# Patient Record
Sex: Female | Born: 1975 | Race: White | Hispanic: No | Marital: Single
Health system: Southern US, Community
[De-identification: ages and names within clinical notes are randomized; demographics above are authoritative.]

## PROBLEM LIST (undated history)

## (undated) DIAGNOSIS — R87629 Unspecified abnormal cytological findings in specimens from vagina: Secondary | ICD-10-CM

## (undated) HISTORY — PX: TONSILLECTOMY: SUR1361

## (undated) HISTORY — DX: Unspecified abnormal cytological findings in specimens from vagina: R87.629

## (undated) HISTORY — PX: APPENDECTOMY: SHX54

---

## 2000-04-10 ENCOUNTER — Inpatient Hospital Stay (HOSPITAL_COMMUNITY): Admission: AD | Admit: 2000-04-10 | Discharge: 2000-04-10 | Payer: Self-pay | Admitting: *Deleted

## 2000-04-10 ENCOUNTER — Encounter: Payer: Self-pay | Admitting: *Deleted

## 2013-03-03 HISTORY — PX: OTHER SURGICAL HISTORY: SHX169

## 2015-03-04 NOTE — L&D Delivery Note (Signed)
Delivery Note At 11:25 PM a viable female was delivered via VBAC, Spontaneous (Presentation: ROA ).  APGAR: 9, 9; weight: pending.   Placenta status: spont, intact, but smaller than expected with calcifications- to path. Cord: 3 vessel.    Anesthesia:  none Episiotomy: None Lacerations:  none Est. Blood Loss (mL): 25  Mom to postpartum.  Baby to Couplet care / Skin to Skin.  Cam HaiSHAW, Ara Grandmaison CNM 12/19/2015, 11:54 PM

## 2015-07-13 ENCOUNTER — Other Ambulatory Visit (HOSPITAL_COMMUNITY)
Admission: RE | Admit: 2015-07-13 | Discharge: 2015-07-13 | Disposition: A | Discharge status: Home / Self Care | Payer: Medicaid Other | Source: Ambulatory Visit | Attending: Family | Admitting: Family

## 2015-07-13 ENCOUNTER — Ambulatory Visit (INDEPENDENT_AMBULATORY_CARE_PROVIDER_SITE_OTHER): Payer: Medicaid Other | Admitting: Family

## 2015-07-13 ENCOUNTER — Encounter: Payer: Self-pay | Admitting: Family

## 2015-07-13 VITALS — BP 137/89 | HR 99 | Ht 69.0 in | Wt 206.0 lb

## 2015-07-13 DIAGNOSIS — Z113 Encounter for screening for infections with a predominantly sexual mode of transmission: Secondary | ICD-10-CM | POA: Diagnosis present

## 2015-07-13 DIAGNOSIS — Z01419 Encounter for gynecological examination (general) (routine) without abnormal findings: Secondary | ICD-10-CM | POA: Diagnosis present

## 2015-07-13 DIAGNOSIS — O0992 Supervision of high risk pregnancy, unspecified, second trimester: Secondary | ICD-10-CM | POA: Diagnosis not present

## 2015-07-13 DIAGNOSIS — Z36 Encounter for antenatal screening of mother: Secondary | ICD-10-CM

## 2015-07-13 DIAGNOSIS — O09529 Supervision of elderly multigravida, unspecified trimester: Secondary | ICD-10-CM | POA: Insufficient documentation

## 2015-07-13 DIAGNOSIS — Z1151 Encounter for screening for human papillomavirus (HPV): Secondary | ICD-10-CM | POA: Insufficient documentation

## 2015-07-13 DIAGNOSIS — O34219 Maternal care for unspecified type scar from previous cesarean delivery: Secondary | ICD-10-CM

## 2015-07-13 DIAGNOSIS — O09892 Supervision of other high risk pregnancies, second trimester: Secondary | ICD-10-CM

## 2015-07-13 DIAGNOSIS — O99820 Streptococcus B carrier state complicating pregnancy: Secondary | ICD-10-CM | POA: Insufficient documentation

## 2015-07-13 DIAGNOSIS — O09522 Supervision of elderly multigravida, second trimester: Secondary | ICD-10-CM

## 2015-07-13 DIAGNOSIS — Z1389 Encounter for screening for other disorder: Secondary | ICD-10-CM

## 2015-07-13 LAB — HIV ANTIBODY (ROUTINE TESTING W REFLEX): HIV 1&2 Ab, 4th Generation: NONREACTIVE

## 2015-07-13 MED ORDER — PRENATE DHA 18-0.6-0.4-300 MG PO CAPS: 1.0000 | ORAL_CAPSULE | Freq: Every day | ORAL | Status: DC

## 2015-07-13 NOTE — Patient Instructions (Signed)

## 2015-07-13 NOTE — Progress Notes (Signed)
Patient transfer from [place] where she was seen one time for intial OB.Patient reports that she was seen and they told her it would be likely that she would have a miscarriage. Has not had anymore bleeding since early April.  Bedside ultrasound:Femur length consistent with 15.3 weeks. Armandina StammerJennifer Freddy Spadafora RN BSN

## 2015-07-13 NOTE — Progress Notes (Signed)
Subjective:    Amy Horn is a [id number] 6975w3d being seen today for her first obstetrical visit.  Her obstetrical history is significant for advanced maternal age and prior csection due to breech presentation with prior and subsequent vaginal deliveries. Experienced bleeding in early pregnancy.   Also discussed feelings surrounding having newborn with autistic son, challenges with space and needs of older son.   Patient does intend to breast feed. Pregnancy history fully reviewed.  Patient reports no complaints.  Filed Vitals:   07/13/15 0900 07/13/15 0901  BP:  137/89  Pulse:  99  Height: 5\' 9"  (1.753 m)   Weight:  206 lb (93.441 kg)    HISTORY: OB History  Gravida Para Term Preterm AB SAB TAB Ectopic Multiple Living  10 5 5  3 3   2 6     # Outcome Date GA Lbr Len/2nd Weight Sex Delivery Anes PTL Lv  10 Current           9 Gravida 05/02/06 5014w0d  5 lb (2.268 kg) F    Y  8 SAB 2008          7 Term 03/04/05    F CS-Unspec  Y Y     Comments: twins  6 Term 03/04/05 1014w0d  7 lb 1 oz (3.204 kg) M CS-Unspec   Y  5 Term 08/25/00    Amy MechM Vag-Spont   Y  4 Term 08/25/00 4014w0d  5 lb 2 oz (2.325 kg) F Vag-Spont   Y  3 SAB 2000          2 Term 07/30/93 1748w0d  8 lb 13 oz (3.997 kg) F Vag-Spont EPI  Y  1 SAB 1994             History reviewed. No pertinent past medical history. Past Surgical History  Procedure Laterality Date  . Herniated disc  2015    L5  . Tonsillectomy    . Appendectomy    . Cesarean section  2007   Family History  Problem Relation Age of Onset  . Heart disease Mother   . Hypertension Father   . Cancer Paternal Grandmother   . Diabetes Neg Hx      Exam   BP 137/89 mmHg  Pulse 99  Ht 5\' 9"  (1.753 m)  Wt 206 lb (93.441 kg)  BMI 30.41 kg/m2  LMP 03/27/2015 (Approximate) Uterine Size: size equals dates  Pelvic Exam:    Perineum: No Hemorrhoids, Normal Perineum   Vulva: normal   Vagina:  normal mucosa, normal discharge, no palpable nodules   pH:  Not done   Cervix: no bleeding following Pap, no cervical motion tenderness and no lesions   Adnexa: normal adnexa and no mass, fullness, tenderness   Bony Pelvis: Adequate  System: Breast:  No nipple retraction or dimpling, No nipple discharge or bleeding, No axillary or supraclavicular adenopathy, Normal to palpation without dominant masses   Skin: normal coloration and turgor, no rashes    Neurologic: negative   Extremities: normal strength, tone, and muscle mass   HEENT neck supple with midline trachea and thyroid without masses   Mouth/Teeth mucous membranes moist, pharynx normal without lesions   Neck supple and no masses   Cardiovascular: regular rate and rhythm, no murmurs or gallops   Respiratory:  appears well, vitals normal, no respiratory distress, acyanotic, normal RR, neck free of mass or lymphadenopathy, chest clear, no wheezing, crepitations, rhonchi, normal symmetric air entry   Abdomen: soft,  non-tender; bowel sounds normal; no masses,  no organomegaly   Urinary: urethral meatus normal      Assessment:    Pregnancy: [id number] Patient Active Problem List   Diagnosis Date Noted  . Supervision of high risk pregnancy, antepartum 07/13/2015  . Multigravida of advanced maternal age in second trimester 07/13/2015  . Previous cesarean delivery affecting pregnancy, antepartum 07/13/2015        Plan:     Initial labs drawn.  Pap smear collected. Prenatal vitamins. Problem list reviewed and updated. Genetic Screening discussed First Screen, Quad Screen, and Harmony: declined.  Ultrasound discussed; fetal survey: ordered.  Follow up in 4 weeks.   Marlis Edelson 07/13/2015

## 2015-07-15 LAB — CULTURE, URINE COMPREHENSIVE
Colony Count: NO GROWTH
Organism ID, Bacteria: NO GROWTH

## 2015-07-16 LAB — OBSTETRIC PANEL
Antibody Screen: NEGATIVE
Basophils Absolute: 0 cells/uL (ref 0–200)
Basophils Relative: 0 %
Eosinophils Absolute: 735 cells/uL — ABNORMAL HIGH (ref 15–500)
Eosinophils Relative: 5 %
HCT: 40.5 % (ref 35.0–45.0)
Hemoglobin: 13.5 g/dL (ref 11.7–15.5)
Hepatitis B Surface Ag: NEGATIVE
Lymphocytes Relative: 18 %
Lymphs Abs: 2646 cells/uL (ref 850–3900)
MCH: 28.5 pg (ref 27.0–33.0)
MCHC: 33.3 g/dL (ref 32.0–36.0)
MCV: 85.6 fL (ref 80.0–100.0)
MPV: 10.4 fL (ref 7.5–12.5)
Monocytes Absolute: 1029 cells/uL — ABNORMAL HIGH (ref 200–950)
Monocytes Relative: 7 %
Neutro Abs: 10290 cells/uL — ABNORMAL HIGH (ref 1500–7800)
Neutrophils Relative %: 70 %
Platelets: 384 10*3/uL (ref 140–400)
RBC: 4.73 MIL/uL (ref 3.80–5.10)
RDW: 14.2 % (ref 11.0–15.0)
Rh Type: POSITIVE
Rubella: 1.04 Index — ABNORMAL HIGH (ref <0.90)
WBC: 14.7 10*3/uL — ABNORMAL HIGH (ref 3.8–10.8)

## 2015-07-16 LAB — URINE CYTOLOGY ANCILLARY ONLY
Chlamydia: NEGATIVE
Neisseria Gonorrhea: NEGATIVE

## 2015-07-17 LAB — CYTOLOGY - PAP

## 2015-07-20 LAB — CYSTIC FIBROSIS DIAGNOSTIC STUDY

## 2015-07-25 ENCOUNTER — Encounter: Payer: Self-pay | Admitting: *Deleted

## 2015-08-01 ENCOUNTER — Encounter (HOSPITAL_COMMUNITY): Payer: Self-pay | Admitting: Family

## 2015-08-08 ENCOUNTER — Encounter (HOSPITAL_COMMUNITY): Payer: Self-pay

## 2015-08-09 ENCOUNTER — Encounter (HOSPITAL_COMMUNITY): Payer: Self-pay

## 2015-08-09 ENCOUNTER — Ambulatory Visit (HOSPITAL_COMMUNITY)
Admission: RE | Admit: 2015-08-09 | Discharge: 2015-08-09 | Disposition: A | Discharge status: Home / Self Care | Payer: Medicaid Other | Source: Ambulatory Visit | Attending: Family | Admitting: Family

## 2015-08-09 ENCOUNTER — Other Ambulatory Visit: Payer: Self-pay | Admitting: Family

## 2015-08-09 DIAGNOSIS — Z3A19 19 weeks gestation of pregnancy: Secondary | ICD-10-CM | POA: Diagnosis not present

## 2015-08-09 DIAGNOSIS — Z36 Encounter for antenatal screening of mother: Secondary | ICD-10-CM | POA: Insufficient documentation

## 2015-08-09 DIAGNOSIS — Z1389 Encounter for screening for other disorder: Secondary | ICD-10-CM

## 2015-08-09 DIAGNOSIS — O09522 Supervision of elderly multigravida, second trimester: Secondary | ICD-10-CM | POA: Diagnosis not present

## 2015-08-09 DIAGNOSIS — O34219 Maternal care for unspecified type scar from previous cesarean delivery: Secondary | ICD-10-CM | POA: Insufficient documentation

## 2015-08-09 DIAGNOSIS — Z3689 Encounter for other specified antenatal screening: Secondary | ICD-10-CM

## 2015-08-10 ENCOUNTER — Encounter: Payer: Medicaid Other | Admitting: Certified Nurse Midwife

## 2015-08-13 ENCOUNTER — Ambulatory Visit (INDEPENDENT_AMBULATORY_CARE_PROVIDER_SITE_OTHER): Payer: Medicaid Other | Admitting: Obstetrics & Gynecology

## 2015-08-13 VITALS — BP 135/83 | HR 84 | Wt 207.0 lb

## 2015-08-13 DIAGNOSIS — O0992 Supervision of high risk pregnancy, unspecified, second trimester: Secondary | ICD-10-CM

## 2015-08-13 DIAGNOSIS — O09522 Supervision of elderly multigravida, second trimester: Secondary | ICD-10-CM

## 2015-08-13 NOTE — Progress Notes (Signed)
Subjective:  Jonita AlbeeChristina R Twersky is a 40 y.o. [phone] at 652w6d being seen today for ongoing prenatal care.  She is currently monitored for the following issues for this high-risk pregnancy and has Supervision of high risk pregnancy, antepartum; Multigravida of advanced maternal age; and Previous cesarean delivery affecting pregnancy, antepartum on her problem list.  Patient reports no complaints.  Contractions: Not present. Vag. Bleeding: None.  Movement: Present. Denies leaking of fluid.   The following portions of the patient's history were reviewed and updated as appropriate: allergies, current medications, past family history, past medical history, past social history, past surgical history and problem list. Problem list updated.  Objective:   Filed Vitals:   08/13/15 1412  BP: 135/83  Pulse: 84  Weight: 207 lb (93.895 kg)    Fetal Status: Fetal Heart Rate (bpm): 151   Movement: Present     General:  Alert, oriented and cooperative. Patient is in no acute distress.  Skin: Skin is warm and dry. No rash noted.   Cardiovascular: Normal heart rate noted  Respiratory: Normal respiratory effort, no problems with respiration noted  Abdomen: Soft, gravid, appropriate for gestational age. Pain/Pressure: Absent     Pelvic: Cervical exam deferred        Extremities: Normal range of motion.  Edema: None  Mental Status: Normal mood and affect. Normal behavior. Normal judgment and thought content.   Urinalysis: Urine Protein: Negative Urine Glucose: Negative  Assessment and Plan:  Pregnancy: [id number] at 282w6d  1. Multigravida of advanced maternal age in second trimester Advanced Maternal Age > 41 y.o. - 69O09.519 (primip), 23O09.529 (multip) 20-24-28-32-36 36//2 x wk 40  Serial growth scans ordered. Had normal anatomy scan. Will start testing at 36 weeks. - US MFM OB FOLLOW UP; Standing  2. Supervision of high risk pregnancy, antepartum, second trimester Preterm labor symptoms and general  obstetric precautions including but not limited to vaginal bleeding, contractions, leaking of fluid and fetal movement were reviewed in detail with the patient. Please refer to After Visit Summary for other counseling recommendations.  Return in about 4 weeks (around 09/10/2015) for OB Visit.   Tereso NewcomerUgonna A Jahmil Macleod, MD

## 2015-08-13 NOTE — Patient Instructions (Signed)
Return to clinic for any scheduled appointments or obstetric concerns, or go to [facility] for evaluation  

## 2015-09-10 ENCOUNTER — Ambulatory Visit (INDEPENDENT_AMBULATORY_CARE_PROVIDER_SITE_OTHER): Payer: Medicaid Other | Admitting: Obstetrics & Gynecology

## 2015-09-10 VITALS — BP 125/78 | HR 94 | Wt 210.0 lb

## 2015-09-10 DIAGNOSIS — O34219 Maternal care for unspecified type scar from previous cesarean delivery: Secondary | ICD-10-CM

## 2015-09-10 DIAGNOSIS — O99210 Obesity complicating pregnancy, unspecified trimester: Secondary | ICD-10-CM

## 2015-09-10 DIAGNOSIS — O0992 Supervision of high risk pregnancy, unspecified, second trimester: Secondary | ICD-10-CM

## 2015-09-10 DIAGNOSIS — Z641 Problems related to multiparity: Secondary | ICD-10-CM

## 2015-09-10 DIAGNOSIS — O09522 Supervision of elderly multigravida, second trimester: Secondary | ICD-10-CM

## 2015-09-10 NOTE — Progress Notes (Signed)
Subjective:  Amy Horn is a 40 y.o. [phone] at 6724w6d being seen today for ongoing prenatal care.  She is currently monitored for the following issues for this high-risk pregnancy and has Supervision of high risk pregnancy, antepartum; Multigravida of advanced maternal age; Previous cesarean delivery affecting pregnancy, antepartum; and Obesity in pregnancy on her problem list.  Patient reports no complaints.  Contractions: Not present. Vag. Bleeding: None.  Movement: Present. Denies leaking of fluid.   The following portions of the patient's history were reviewed and updated as appropriate: allergies, current medications, past family history, past medical history, past social history, past surgical history and problem list. Problem list updated.  Objective:   Filed Vitals:   09/10/15 1434  BP: 125/78  Pulse: 94  Weight: 210 lb (95.255 kg)    Fetal Status: Fetal Heart Rate (bpm): 141   Movement: Present     General:  Alert, oriented and cooperative. Patient is in no acute distress.  Skin: Skin is warm and dry. No rash noted.   Cardiovascular: Normal heart rate noted  Respiratory: Normal respiratory effort, no problems with respiration noted  Abdomen: Soft, gravid, appropriate for gestational age. Pain/Pressure: Absent     Pelvic:  Cervical exam deferred        Extremities: Normal range of motion.  Edema: None  Mental Status: Normal mood and affect. Normal behavior. Normal judgment and thought content.   Urinalysis: Urine Protein: Negative Urine Glucose: Negative  Assessment and Plan:  Pregnancy: [id number] at 4024w6d  1. Previous cesarean delivery affecting pregnancy, antepartum - She desires a TOLAC  2. Multigravida of advanced maternal age, second trimester - Follow up MFM u/s next week  3. Supervision of high risk pregnancy, antepartum, second trimester   4. Obesity in pregnancy - Discussed weight gain recommendations  Preterm labor symptoms and general obstetric  precautions including but not limited to vaginal bleeding, contractions, leaking of fluid and fetal movement were reviewed in detail with the patient. Please refer to After Visit Summary for other counseling recommendations.  Return in about 4 weeks (around 10/08/2015) for glucola, labs, tdap.   Allie BossierMyra C Luvada Salamone, MD

## 2015-09-12 ENCOUNTER — Ambulatory Visit (HOSPITAL_COMMUNITY)
Admission: RE | Admit: 2015-09-12 | Discharge: 2015-09-12 | Disposition: A | Discharge status: Home / Self Care | Payer: Medicaid Other | Source: Ambulatory Visit | Attending: Obstetrics & Gynecology | Admitting: Obstetrics & Gynecology

## 2015-09-12 ENCOUNTER — Encounter (HOSPITAL_COMMUNITY): Payer: Self-pay

## 2015-09-12 DIAGNOSIS — O09522 Supervision of elderly multigravida, second trimester: Secondary | ICD-10-CM | POA: Diagnosis not present

## 2015-09-12 DIAGNOSIS — O34219 Maternal care for unspecified type scar from previous cesarean delivery: Secondary | ICD-10-CM | POA: Insufficient documentation

## 2015-09-12 DIAGNOSIS — Z3A24 24 weeks gestation of pregnancy: Secondary | ICD-10-CM | POA: Diagnosis not present

## 2015-10-08 ENCOUNTER — Ambulatory Visit (INDEPENDENT_AMBULATORY_CARE_PROVIDER_SITE_OTHER): Payer: Medicaid Other | Admitting: Obstetrics & Gynecology

## 2015-10-08 VITALS — BP 127/85 | HR 88 | Wt 211.0 lb

## 2015-10-08 DIAGNOSIS — E669 Obesity, unspecified: Secondary | ICD-10-CM

## 2015-10-08 DIAGNOSIS — Z3492 Encounter for supervision of normal pregnancy, unspecified, second trimester: Secondary | ICD-10-CM

## 2015-10-08 DIAGNOSIS — O99212 Obesity complicating pregnancy, second trimester: Secondary | ICD-10-CM | POA: Diagnosis not present

## 2015-10-08 DIAGNOSIS — O09522 Supervision of elderly multigravida, second trimester: Secondary | ICD-10-CM

## 2015-10-08 DIAGNOSIS — O0993 Supervision of high risk pregnancy, unspecified, third trimester: Secondary | ICD-10-CM

## 2015-10-08 NOTE — Progress Notes (Signed)
Subjective:  Amy Horn is a 40 y.o. [phone] at 7140w6d being seen today for ongoing prenatal care.  She is currently monitored for the following issues for this high-risk pregnancy and has Supervision of high risk pregnancy, antepartum; Multigravida of advanced maternal age; Previous cesarean delivery affecting pregnancy, antepartum; Obesity in pregnancy; and Grand multiparity on her problem list.  Patient reports no complaints.  Contractions: Not present. Vag. Bleeding: None.  Movement: Present. Denies leaking of fluid.   The following portions of the patient's history were reviewed and updated as appropriate: allergies, current medications, past family history, past medical history, past social history, past surgical history and problem list. Problem list updated.  Objective:   Vitals:   10/08/15 1540  BP: 127/85  Pulse: 88  Weight: 211 lb (95.7 kg)    Fetal Status: Fetal Heart Rate (bpm): 145   Movement: Present     General:  Alert, oriented and cooperative. Patient is in no acute distress.  Skin: Skin is warm and dry. No rash noted.   Cardiovascular: Normal heart rate noted  Respiratory: Normal respiratory effort, no problems with respiration noted  Abdomen: Soft, gravid, appropriate for gestational age. Pain/Pressure: Present     Pelvic:  Cervical exam deferred        Extremities: Normal range of motion.  Edema: Trace  Mental Status: Normal mood and affect. Normal behavior. Normal judgment and thought content.   Urinalysis: Urine Protein: Negative Urine Glucose: Negative  Assessment and Plan:  Pregnancy: [id number] at 2540w6d  1. Normal pregnancy, second trimester  - Glucose Tolerance, 1 HR (50g) - HIV antibody (with reflex) - CBC - RPR  Preterm labor symptoms and general obstetric precautions including but not limited to vaginal bleeding, contractions, leaking of fluid and fetal movement were reviewed in detail with the patient. Please refer to After Visit Summary for  other counseling recommendations.  No Follow-up on file.   Allie BossierMyra C Hezzie Karim, MD

## 2015-10-09 ENCOUNTER — Encounter: Payer: Self-pay | Admitting: *Deleted

## 2015-10-09 ENCOUNTER — Telehealth: Payer: Self-pay | Admitting: *Deleted

## 2015-10-09 DIAGNOSIS — O0993 Supervision of high risk pregnancy, unspecified, third trimester: Secondary | ICD-10-CM

## 2015-10-09 LAB — GLUCOSE TOLERANCE, 1 HOUR (50G) W/O FASTING: GLUCOSE, 1 HR, GESTATIONAL: 111 mg/dL (ref <140)

## 2015-10-09 LAB — HIV ANTIBODY (ROUTINE TESTING W REFLEX): HIV 1&2 Ab, 4th Generation: NONREACTIVE

## 2015-10-09 LAB — CBC
HEMATOCRIT: 36.8 % (ref 35.0–45.0)
Hemoglobin: 12.2 g/dL (ref 11.7–15.5)
MCH: 27.4 pg (ref 27.0–33.0)
MCHC: 33.2 g/dL (ref 32.0–36.0)
MCV: 82.7 fL (ref 80.0–100.0)
MPV: 10.9 fL (ref 7.5–12.5)
PLATELETS: 381 10*3/uL (ref 140–400)
RBC: 4.45 MIL/uL (ref 3.80–5.10)
RDW: 15 % (ref 11.0–15.0)
WBC: 14.9 10*3/uL — AB (ref 3.8–10.8)

## 2015-10-09 LAB — RPR

## 2015-10-09 NOTE — Telephone Encounter (Signed)
Pt notified of norma 28 week labs

## 2015-10-24 ENCOUNTER — Ambulatory Visit (HOSPITAL_COMMUNITY): Payer: Medicaid Other

## 2015-10-29 ENCOUNTER — Ambulatory Visit (INDEPENDENT_AMBULATORY_CARE_PROVIDER_SITE_OTHER): Payer: Medicaid Other | Admitting: Advanced Practice Midwife

## 2015-10-29 VITALS — BP 142/88 | HR 80 | Wt 212.0 lb

## 2015-10-29 DIAGNOSIS — O0993 Supervision of high risk pregnancy, unspecified, third trimester: Secondary | ICD-10-CM

## 2015-10-29 DIAGNOSIS — O09523 Supervision of elderly multigravida, third trimester: Secondary | ICD-10-CM | POA: Diagnosis not present

## 2015-10-29 DIAGNOSIS — O34219 Maternal care for unspecified type scar from previous cesarean delivery: Secondary | ICD-10-CM | POA: Diagnosis not present

## 2015-10-29 DIAGNOSIS — O163 Unspecified maternal hypertension, third trimester: Secondary | ICD-10-CM

## 2015-10-29 NOTE — Patient Instructions (Signed)

## 2015-10-29 NOTE — Progress Notes (Signed)
  Subjective:  Amy Horn is a 40 y.o. [phone] at 17w6dbeing seen today for ongoing prenatal care.  She is currently monitored for the following issues for this high-risk pregnancy and has Supervision of high risk pregnancy, antepartum; Multigravida of advanced maternal age; Previous cesarean delivery affecting pregnancy, antepartum; Obesity in pregnancy; and [place]iparity on her problem list.  Patient reports no complaints.  Contractions: Not present. Vag. Bleeding: None.  Movement: Present. Denies leaking of fluid.   The following portions of the patient's history were reviewed and updated as appropriate: allergies, current medications, past family history, past medical history, past social history, past surgical history and problem list. Problem list updated.  Objective:   Vitals:   10/29/15 1107  BP: (!) 142/88  Pulse: 80  Weight: 212 lb (96.2 kg)    Fetal Status: Fetal Heart Rate (bpm): 143 Fundal Height: 31 cm Movement: Present     General:  Alert, oriented and cooperative. Patient is in no acute distress.  Skin: Skin is warm and dry. No rash noted.   Cardiovascular: Normal heart rate noted  Respiratory: Normal respiratory effort, no problems with respiration noted  Abdomen: Soft, gravid, appropriate for gestational age. Pain/Pressure: Present     Pelvic:  Cervical exam deferred        Extremities: Normal range of motion.  Edema: Trace  Mental Status: Normal mood and affect. Normal behavior. Normal judgment and thought content.   Urinalysis: Urine Protein: Negative Urine Glucose: Negative  Assessment and Plan:  Pregnancy: [id number] 345w6d1. Previous cesarean delivery affecting pregnancy, antepartum   2. Hypertension affecting pregnancy, third trimester  - CBC - Comp Met (CMET) - Protein / creatinine ratio, urine  3. Multigravida of advanced maternal age, third trimester   4. Supervision of high risk pregnancy, antepartum, third trimester   Preterm  labor symptoms and general obstetric precautions including but not limited to vaginal bleeding, contractions, leaking of fluid and fetal movement were reviewed in detail with the patient. Please refer to After Visit Summary for other counseling recommendations.  GrEd BlalockSKorea/31 F/U in 2 weeks   ViManya SilvasCNNorth Dakota

## 2015-10-29 NOTE — Progress Notes (Signed)
routine

## 2015-10-30 ENCOUNTER — Encounter: Payer: Self-pay | Admitting: *Deleted

## 2015-10-30 LAB — COMPREHENSIVE METABOLIC PANEL
ALK PHOS: 91 U/L (ref 33–115)
ALT: 15 U/L (ref 6–29)
AST: 14 U/L (ref 10–30)
Albumin: 3.4 g/dL — ABNORMAL LOW (ref 3.6–5.1)
BILIRUBIN TOTAL: 0.9 mg/dL (ref 0.2–1.2)
BUN: 5 mg/dL — AB (ref 7–25)
CO2: 22 mmol/L (ref 20–31)
Calcium: 8.8 mg/dL (ref 8.6–10.2)
Chloride: 104 mmol/L (ref 98–110)
Creat: 0.55 mg/dL (ref 0.50–1.10)
GLUCOSE: 73 mg/dL (ref 65–99)
Potassium: 4.6 mmol/L (ref 3.5–5.3)
SODIUM: 137 mmol/L (ref 135–146)
Total Protein: 6.4 g/dL (ref 6.1–8.1)

## 2015-10-30 LAB — PROTEIN / CREATININE RATIO, URINE
Creatinine, Urine: 59 mg/dL (ref 20–320)
Protein Creatinine Ratio: 102 mg/g creat (ref 21–161)
Total Protein, Urine: 6 mg/dL (ref 5–24)

## 2015-10-30 LAB — CBC
HEMATOCRIT: 37.8 % (ref 35.0–45.0)
HEMOGLOBIN: 12.2 g/dL (ref 11.7–15.5)
MCH: 26.9 pg — ABNORMAL LOW (ref 27.0–33.0)
MCHC: 32.3 g/dL (ref 32.0–36.0)
MCV: 83.3 fL (ref 80.0–100.0)
MPV: 11.6 fL (ref 7.5–12.5)
PLATELETS: 380 10*3/uL (ref 140–400)
RBC: 4.54 MIL/uL (ref 3.80–5.10)
RDW: 14.9 % (ref 11.0–15.0)
WBC: 13.3 10*3/uL — AB (ref 3.8–10.8)

## 2015-10-31 ENCOUNTER — Encounter (HOSPITAL_COMMUNITY): Payer: Self-pay

## 2015-10-31 ENCOUNTER — Encounter: Payer: Self-pay | Admitting: Advanced Practice Midwife

## 2015-10-31 ENCOUNTER — Telehealth: Payer: Self-pay | Admitting: *Deleted

## 2015-10-31 DIAGNOSIS — O169 Unspecified maternal hypertension, unspecified trimester: Secondary | ICD-10-CM | POA: Insufficient documentation

## 2015-10-31 NOTE — Telephone Encounter (Signed)
Pt notified of normal pre-eclampsia labs per Ivonne AndrewV Murri, CNM

## 2015-11-01 ENCOUNTER — Other Ambulatory Visit (HOSPITAL_COMMUNITY): Payer: Self-pay | Admitting: Maternal and Fetal Medicine

## 2015-11-01 ENCOUNTER — Encounter (HOSPITAL_COMMUNITY): Payer: Self-pay

## 2015-11-01 ENCOUNTER — Ambulatory Visit (HOSPITAL_COMMUNITY)
Admission: RE | Admit: 2015-11-01 | Discharge: 2015-11-01 | Disposition: A | Discharge status: Home / Self Care | Payer: Medicaid Other | Source: Ambulatory Visit | Attending: Family | Admitting: Family

## 2015-11-01 VITALS — BP 131/93 | HR 90 | Wt 212.0 lb

## 2015-11-01 DIAGNOSIS — Z3A31 31 weeks gestation of pregnancy: Secondary | ICD-10-CM

## 2015-11-01 DIAGNOSIS — O10913 Unspecified pre-existing hypertension complicating pregnancy, third trimester: Secondary | ICD-10-CM

## 2015-11-01 DIAGNOSIS — O09522 Supervision of elderly multigravida, second trimester: Secondary | ICD-10-CM | POA: Diagnosis present

## 2015-11-01 DIAGNOSIS — O169 Unspecified maternal hypertension, unspecified trimester: Secondary | ICD-10-CM

## 2015-11-13 ENCOUNTER — Ambulatory Visit (INDEPENDENT_AMBULATORY_CARE_PROVIDER_SITE_OTHER): Payer: Medicaid Other | Admitting: Obstetrics and Gynecology

## 2015-11-13 VITALS — BP 138/79 | HR 82 | Wt 219.0 lb

## 2015-11-13 DIAGNOSIS — O169 Unspecified maternal hypertension, unspecified trimester: Secondary | ICD-10-CM

## 2015-11-13 DIAGNOSIS — O163 Unspecified maternal hypertension, third trimester: Secondary | ICD-10-CM | POA: Diagnosis not present

## 2015-11-13 DIAGNOSIS — O09523 Supervision of elderly multigravida, third trimester: Secondary | ICD-10-CM

## 2015-11-13 DIAGNOSIS — O99210 Obesity complicating pregnancy, unspecified trimester: Secondary | ICD-10-CM

## 2015-11-13 DIAGNOSIS — O99213 Obesity complicating pregnancy, third trimester: Secondary | ICD-10-CM

## 2015-11-13 DIAGNOSIS — E669 Obesity, unspecified: Secondary | ICD-10-CM | POA: Diagnosis not present

## 2015-11-13 DIAGNOSIS — Z641 Problems related to multiparity: Secondary | ICD-10-CM

## 2015-11-13 DIAGNOSIS — O34219 Maternal care for unspecified type scar from previous cesarean delivery: Secondary | ICD-10-CM

## 2015-11-13 DIAGNOSIS — O0993 Supervision of high risk pregnancy, unspecified, third trimester: Secondary | ICD-10-CM

## 2015-11-13 NOTE — Progress Notes (Signed)
Dizziness and HA's

## 2015-11-13 NOTE — Progress Notes (Signed)
   PRENATAL VISIT NOTE  Subjective:  Jonita AlbeeChristina R Allcock is a 40 y.o. [phone] at [redacted]w[redacted]d being seen today for ongoing prenatal care.  She is currently monitored for the following issues for this high-risk pregnancy and has Supervision of high risk pregnancy, antepartum; Multigravida of advanced maternal age; Previous cesarean delivery affecting pregnancy, antepartum; Obesity in pregnancy; Grand multiparity; and Hypertension in pregnancy, antepartum on her problem list.  Patient reports fatigue.  Contractions: Not present. Vag. Bleeding: None.  Movement: Present. Denies leaking of fluid.   The following portions of the patient's history were reviewed and updated as appropriate: allergies, current medications, past family history, past medical history, past social history, past surgical history and problem list. Problem list updated.  Objective:   Vitals:   11/13/15 1539  BP: 138/79  Pulse: 82  Weight: 219 lb (99.3 kg)    Fetal Status: Fetal Heart Rate (bpm): 147   Movement: Present     General:  Alert, oriented and cooperative. Patient is in no acute distress.  Skin: Skin is warm and dry. No rash noted.   Cardiovascular: Normal heart rate noted  Respiratory: Normal respiratory effort, no problems with respiration noted  Abdomen: Soft, gravid, appropriate for gestational age. Pain/Pressure: Present     Pelvic:  Cervical exam deferred        Extremities: Normal range of motion.  Edema: Trace  Mental Status: Normal mood and affect. Normal behavior. Normal judgment and thought content.   Urinalysis: Urine Protein: Trace Urine Glucose: Negative  Assessment and Plan:  Pregnancy: [id number] at [redacted]w[redacted]d  1. Supervision of high risk pregnancy, antepartum, third trimester Patient is doing well overall. She admits to not drinking a lot of water and not eating as much as she should. She often skips meals. Advised to stay well hydrated and to snack all day if needed to aid with fatigue and  lightheadedness Patient reports infrequent headaches relieved by tylenol  2. Previous cesarean delivery affecting pregnancy, antepartum   3. Obesity in pregnancy   4. Multigravida of advanced maternal age, third trimester   5. Hypertension in pregnancy, antepartum, unspecified trimester Contnue close monitoring Si/sx of preeclampsia reviewed  6. Grand multiparity   Preterm labor symptoms and general obstetric precautions including but not limited to vaginal bleeding, contractions, leaking of fluid and fetal movement were reviewed in detail with the patient. Please refer to After Visit Summary for other counseling recommendations.  No Follow-up on file.  Catalina AntiguaPeggy Maurion Walkowiak, MD

## 2015-11-26 ENCOUNTER — Ambulatory Visit (INDEPENDENT_AMBULATORY_CARE_PROVIDER_SITE_OTHER): Payer: Medicaid Other | Admitting: Certified Nurse Midwife

## 2015-11-26 VITALS — BP 134/89 | HR 94 | Wt 217.0 lb

## 2015-11-26 DIAGNOSIS — O34219 Maternal care for unspecified type scar from previous cesarean delivery: Secondary | ICD-10-CM

## 2015-11-26 DIAGNOSIS — O163 Unspecified maternal hypertension, third trimester: Secondary | ICD-10-CM

## 2015-11-26 DIAGNOSIS — O09523 Supervision of elderly multigravida, third trimester: Secondary | ICD-10-CM

## 2015-11-26 DIAGNOSIS — O0993 Supervision of high risk pregnancy, unspecified, third trimester: Secondary | ICD-10-CM

## 2015-11-26 NOTE — Progress Notes (Signed)
Subjective:  Jonita AlbeeChristina R Garrette is a 40 y.o. [phone] at 3546w6d being seen today for ongoing prenatal care.  She is currently monitored for the following issues for this high-risk pregnancy and has Supervision of high risk pregnancy, antepartum; Multigravida of advanced maternal age; Previous cesarean delivery affecting pregnancy, antepartum; Obesity in pregnancy; Grand multiparity; and Hypertension in pregnancy, antepartum on her problem list.  Patient reports cramping x4 days, worse at night, increased pelvic pressure..  Contractions: Not present. Vag. Bleeding: None.  Movement: Present. Denies leaking of fluid. No HA, visual disturbances, or epigastric pain. She reports inadequate hydration.  The following portions of the patient's history were reviewed and updated as appropriate: allergies, current medications, past family history, past medical history, past social history, past surgical history and problem list. Problem list updated.  Objective:   Vitals:   11/26/15 0951  BP: 134/89  Pulse: 94  Weight: 217 lb (98.4 kg)    Fetal Status: Fetal Heart Rate (bpm): 152 Fundal Height: 36 cm Movement: Present  Presentation: Vertex  General:  Alert, oriented and cooperative. Patient is in no acute distress.  Skin: Skin is warm and dry. No rash noted.   Cardiovascular: Normal heart rate noted  Respiratory: Normal respiratory effort, no problems with respiration noted  Abdomen: Soft, gravid, appropriate for gestational age. Pain/Pressure: Present     Pelvic: Vag. Bleeding: None Vag D/C Character: Thick   Cervical exam performed Dilation: Closed Effacement (%): 0 Station: -3  Extremities: Normal range of motion.  Edema: Trace  Mental Status: Normal mood and affect. Normal behavior. Normal judgment and thought content.   Urinalysis: Urine Protein: Negative Urine Glucose: Negative  Assessment and Plan:  Pregnancy: [id number] at 246w6d  1. Supervision of high risk pregnancy, antepartum, third  trimester -no evidence of PTL, cramping likely caused by mild dehydration and fetal engagement  2. Multigravida of advanced maternal age, third trimester -start 2x weekly AT at 36 wks -growth US next wk -delivery by 40 wks  3. Previous cesarean delivery affecting pregnancy, antepartum -planning TOLAC, consent signed  4. Hypertension in pregnancy, antepartum, third trimester -watch BPs closely -pre-e precautions  Preterm labor symptoms and general obstetric precautions including but not limited to vaginal bleeding, contractions, leaking of fluid and fetal movement were reviewed in detail with the patient. Please refer to After Visit Summary for other counseling recommendations.  Return in about 2 weeks (around 12/10/2015) for twice wkly NST, BPP/AFI wkly.   Donette LarryMelanie Hamza Empson, CNM

## 2015-11-28 ENCOUNTER — Ambulatory Visit (HOSPITAL_COMMUNITY)
Admission: RE | Admit: 2015-11-28 | Discharge: 2015-11-28 | Disposition: A | Discharge status: Home / Self Care | Payer: Medicaid Other | Source: Ambulatory Visit | Attending: Family | Admitting: Family

## 2015-11-28 ENCOUNTER — Encounter (HOSPITAL_COMMUNITY): Payer: Self-pay

## 2015-11-28 DIAGNOSIS — O10913 Unspecified pre-existing hypertension complicating pregnancy, third trimester: Secondary | ICD-10-CM

## 2015-11-28 DIAGNOSIS — O10013 Pre-existing essential hypertension complicating pregnancy, third trimester: Secondary | ICD-10-CM | POA: Diagnosis not present

## 2015-11-28 DIAGNOSIS — Z3A35 35 weeks gestation of pregnancy: Secondary | ICD-10-CM | POA: Insufficient documentation

## 2015-11-28 DIAGNOSIS — O09523 Supervision of elderly multigravida, third trimester: Secondary | ICD-10-CM | POA: Diagnosis not present

## 2015-11-28 DIAGNOSIS — O169 Unspecified maternal hypertension, unspecified trimester: Secondary | ICD-10-CM

## 2015-12-11 ENCOUNTER — Other Ambulatory Visit (HOSPITAL_COMMUNITY)
Admission: RE | Admit: 2015-12-11 | Discharge: 2015-12-11 | Disposition: A | Discharge status: Home / Self Care | Payer: Medicaid Other | Source: Ambulatory Visit | Attending: Obstetrics & Gynecology | Admitting: Obstetrics & Gynecology

## 2015-12-11 ENCOUNTER — Ambulatory Visit (INDEPENDENT_AMBULATORY_CARE_PROVIDER_SITE_OTHER): Payer: Medicaid Other | Admitting: Obstetrics & Gynecology

## 2015-12-11 VITALS — BP 139/83 | HR 74 | Wt 217.0 lb

## 2015-12-11 DIAGNOSIS — O163 Unspecified maternal hypertension, third trimester: Secondary | ICD-10-CM

## 2015-12-11 DIAGNOSIS — O0990 Supervision of high risk pregnancy, unspecified, unspecified trimester: Secondary | ICD-10-CM

## 2015-12-11 DIAGNOSIS — Z113 Encounter for screening for infections with a predominantly sexual mode of transmission: Secondary | ICD-10-CM | POA: Insufficient documentation

## 2015-12-11 DIAGNOSIS — Z3689 Encounter for other specified antenatal screening: Secondary | ICD-10-CM

## 2015-12-11 DIAGNOSIS — O133 Gestational [pregnancy-induced] hypertension without significant proteinuria, third trimester: Secondary | ICD-10-CM

## 2015-12-11 LAB — OB RESULTS CONSOLE GC/CHLAMYDIA: Gonorrhea: NEGATIVE

## 2015-12-11 LAB — OB RESULTS CONSOLE GBS: STREP GROUP B AG: POSITIVE

## 2015-12-11 NOTE — Progress Notes (Signed)
   PRENATAL VISIT NOTE  Subjective:  Amy Horn is a 40 y.o. [phone] at 7916w0d being seen today for ongoing prenatal care.  She is currently monitored for the following issues for this high-risk pregnancy and has Supervision of high risk pregnancy, antepartum; Multigravida of advanced maternal age; Previous cesarean delivery affecting pregnancy, antepartum; Obesity in pregnancy; Grand multiparity; and Hypertension in pregnancy, antepartum on her problem list.  Patient reports no complaints.  Contractions: Irregular. Vag. Bleeding: None.  Movement: Present. Denies leaking of fluid.   The following portions of the patient's history were reviewed and updated as appropriate: allergies, current medications, past family history, past medical history, past social history, past surgical history and problem list. Problem list updated.  Objective:   Vitals:   12/11/15 1505  BP: 139/83  Pulse: 74  Weight: 217 lb (98.4 kg)    Fetal Status: Fetal Heart Rate (bpm): NST-R Fundal Height: 35 cm Movement: Present  Presentation: Vertex  General:  Alert, oriented and cooperative. Patient is in no acute distress.  Skin: Skin is warm and dry. No rash noted.   Cardiovascular: Normal heart rate noted  Respiratory: Normal respiratory effort, no problems with respiration noted  Abdomen: Soft, gravid, appropriate for gestational age. Pain/Pressure: Present     Pelvic:  Cervical exam deferred        Extremities: Normal range of motion.  Edema: Trace  Mental Status: Normal mood and affect. Normal behavior. Normal judgment and thought content.   Urinalysis: Urine Protein: Trace Urine Glucose: Negative  Assessment and Plan:  Pregnancy: [id number] at 7816w0d  1. Elevated blood pressure affecting pregnancy in third trimester, antepartum  - Amniotic fluid index with NST; Future - Culture, beta strep (group b only) - GC[facility]Chlamydia [facility] amp [facility]  2. Supervision of high risk pregnancy,  antepartum   Preterm labor symptoms and general obstetric precautions including but not limited to vaginal bleeding, contractions, leaking of fluid and fetal movement were reviewed in detail with the patient. Please refer to After Visit Summary for other counseling recommendations.  No Follow-up on file.  Allie BossierMyra C Mckenze Slone, MD

## 2015-12-11 NOTE — Progress Notes (Signed)
   PRENATAL VISIT NOTE  Subjective:  Amy Horn is a 40 y.o. [phone] at 4334w0d being seen today for ongoing prenatal care.  She is currently monitored for the following issues for this high-risk pregnancy and has Supervision of high risk pregnancy, antepartum; Multigravida of advanced maternal age; Previous cesarean delivery affecting pregnancy, antepartum; Obesity in pregnancy; Grand multiparity; and Hypertension in pregnancy, antepartum on her problem list.  Patient reports no complaints.  Contractions: Irregular. Vag. Bleeding: None.  Movement: Present. Denies leaking of fluid.   The following portions of the patient's history were reviewed and updated as appropriate: allergies, current medications, past family history, past medical history, past social history, past surgical history and problem list. Problem list updated.  Objective:   Vitals:   12/11/15 1505  BP: 139/83  Pulse: 74  Weight: 217 lb (98.4 kg)    Fetal Status: Fetal Heart Rate (bpm): 146   Movement: Present  Presentation: Vertex  General:  Alert, oriented and cooperative. Patient is in no acute distress.  Skin: Skin is warm and dry. No rash noted.   Cardiovascular: Normal heart rate noted  Respiratory: Normal respiratory effort, no problems with respiration noted  Abdomen: Soft, gravid, appropriate for gestational age. Pain/Pressure: Present     Pelvic:  Cervical exam deferred        Extremities: Normal range of motion.  Edema: Trace  Mental Status: Normal mood and affect. Normal behavior. Normal judgment and thought content.   Urinalysis: Urine Protein: Trace Urine Glucose: Negative  Assessment and Plan:  Pregnancy: [id number] at 6634w0d  1. Elevated blood pressure affecting pregnancy in third trimester, antepartum  - Amniotic fluid index with NST; Future  Preterm labor symptoms and general obstetric precautions including but not limited to vaginal bleeding, contractions, leaking of fluid and fetal movement  were reviewed in detail with the patient. Please refer to After Visit Summary for other counseling recommendations.  No Follow-up on file.  Amy BossierMyra C Marsh Heckler, MD

## 2015-12-12 LAB — CULTURE, BETA STREP (GROUP B ONLY)

## 2015-12-13 ENCOUNTER — Telehealth: Payer: Self-pay

## 2015-12-13 LAB — GC/CHLAMYDIA PROBE AMP (~~LOC~~) NOT AT ARMC
Chlamydia: NEGATIVE
NEISSERIA GONORRHEA: NEGATIVE

## 2015-12-13 NOTE — Telephone Encounter (Signed)
Pt is aware of lab results from GBS swab

## 2015-12-14 ENCOUNTER — Ambulatory Visit (INDEPENDENT_AMBULATORY_CARE_PROVIDER_SITE_OTHER): Payer: Medicaid Other | Admitting: *Deleted

## 2015-12-14 VITALS — BP 134/79 | HR 70 | Wt 218.0 lb

## 2015-12-14 DIAGNOSIS — O133 Gestational [pregnancy-induced] hypertension without significant proteinuria, third trimester: Secondary | ICD-10-CM | POA: Diagnosis not present

## 2015-12-14 DIAGNOSIS — O163 Unspecified maternal hypertension, third trimester: Secondary | ICD-10-CM

## 2015-12-18 ENCOUNTER — Encounter: Payer: Medicaid Other | Admitting: Obstetrics & Gynecology

## 2015-12-19 ENCOUNTER — Inpatient Hospital Stay (HOSPITAL_COMMUNITY)
Admission: AD | Admit: 2015-12-19 | Discharge: 2015-12-21 | DRG: 775 | Disposition: A | Discharge status: Home / Self Care | Payer: Medicaid Other | Source: Ambulatory Visit | Attending: Obstetrics & Gynecology | Admitting: Obstetrics & Gynecology

## 2015-12-19 ENCOUNTER — Ambulatory Visit (INDEPENDENT_AMBULATORY_CARE_PROVIDER_SITE_OTHER): Payer: Medicaid Other | Admitting: Obstetrics & Gynecology

## 2015-12-19 ENCOUNTER — Encounter (HOSPITAL_COMMUNITY): Payer: Self-pay | Admitting: *Deleted

## 2015-12-19 ENCOUNTER — Encounter: Payer: Self-pay | Admitting: Obstetrics & Gynecology

## 2015-12-19 VITALS — BP 151/98 | HR 77 | Wt 221.0 lb

## 2015-12-19 DIAGNOSIS — O134 Gestational [pregnancy-induced] hypertension without significant proteinuria, complicating childbirth: Secondary | ICD-10-CM | POA: Diagnosis present

## 2015-12-19 DIAGNOSIS — O34211 Maternal care for low transverse scar from previous cesarean delivery: Secondary | ICD-10-CM | POA: Diagnosis present

## 2015-12-19 DIAGNOSIS — Z3483 Encounter for supervision of other normal pregnancy, third trimester: Secondary | ICD-10-CM | POA: Diagnosis present

## 2015-12-19 DIAGNOSIS — O09523 Supervision of elderly multigravida, third trimester: Secondary | ICD-10-CM

## 2015-12-19 DIAGNOSIS — O99210 Obesity complicating pregnancy, unspecified trimester: Secondary | ICD-10-CM

## 2015-12-19 DIAGNOSIS — O99214 Obesity complicating childbirth: Secondary | ICD-10-CM | POA: Diagnosis present

## 2015-12-19 DIAGNOSIS — O133 Gestational [pregnancy-induced] hypertension without significant proteinuria, third trimester: Secondary | ICD-10-CM

## 2015-12-19 DIAGNOSIS — O139 Gestational [pregnancy-induced] hypertension without significant proteinuria, unspecified trimester: Secondary | ICD-10-CM

## 2015-12-19 DIAGNOSIS — Z8249 Family history of ischemic heart disease and other diseases of the circulatory system: Secondary | ICD-10-CM

## 2015-12-19 DIAGNOSIS — Z3A38 38 weeks gestation of pregnancy: Secondary | ICD-10-CM | POA: Diagnosis not present

## 2015-12-19 DIAGNOSIS — O99824 Streptococcus B carrier state complicating childbirth: Secondary | ICD-10-CM | POA: Diagnosis present

## 2015-12-19 DIAGNOSIS — Z641 Problems related to multiparity: Secondary | ICD-10-CM

## 2015-12-19 DIAGNOSIS — O0990 Supervision of high risk pregnancy, unspecified, unspecified trimester: Secondary | ICD-10-CM

## 2015-12-19 DIAGNOSIS — Z6832 Body mass index (BMI) 32.0-32.9, adult: Secondary | ICD-10-CM | POA: Diagnosis not present

## 2015-12-19 DIAGNOSIS — E669 Obesity, unspecified: Secondary | ICD-10-CM | POA: Diagnosis present

## 2015-12-19 DIAGNOSIS — O34219 Maternal care for unspecified type scar from previous cesarean delivery: Secondary | ICD-10-CM

## 2015-12-19 LAB — CBC
HCT: 36 % (ref 35.0–45.0)
HEMATOCRIT: 35.8 % — AB (ref 36.0–46.0)
HEMOGLOBIN: 12 g/dL (ref 11.7–15.5)
HEMOGLOBIN: 12.1 g/dL (ref 12.0–15.0)
MCH: 26.7 pg — AB (ref 27.0–33.0)
MCH: 27.2 pg (ref 26.0–34.0)
MCHC: 33.3 g/dL (ref 32.0–36.0)
MCHC: 33.8 g/dL (ref 30.0–36.0)
MCV: 80 fL (ref 80.0–100.0)
MCV: 80.4 fL (ref 78.0–100.0)
MPV: 10.6 fL (ref 7.5–12.5)
PLATELETS: 378 10*3/uL (ref 140–400)
Platelets: 362 10*3/uL (ref 150–400)
RBC: 4.5 MIL/uL (ref 3.80–5.10)
RBC: 4.45 MIL/uL (ref 3.87–5.11)
RDW: 14.8 % (ref 11.0–15.0)
RDW: 15.1 % (ref 11.5–15.5)
WBC: 12.8 10*3/uL — AB (ref 3.8–10.8)
WBC: 15.6 10*3/uL — ABNORMAL HIGH (ref 4.0–10.5)

## 2015-12-19 LAB — PROTEIN / CREATININE RATIO, URINE
CREATININE, URINE: 138 mg/dL
PROTEIN CREATININE RATIO: 0.15 mg/mg{creat} (ref 0.00–0.15)
TOTAL PROTEIN, URINE: 21 mg/dL

## 2015-12-19 LAB — COMPREHENSIVE METABOLIC PANEL
ALT: 11 U/L (ref 6–29)
AST: 13 U/L (ref 10–30)
Albumin: 3.2 g/dL — ABNORMAL LOW (ref 3.6–5.1)
Alkaline Phosphatase: 116 U/L — ABNORMAL HIGH (ref 33–115)
BILIRUBIN TOTAL: 0.5 mg/dL (ref 0.2–1.2)
BUN: 6 mg/dL — AB (ref 7–25)
CHLORIDE: 104 mmol/L (ref 98–110)
CO2: 22 mmol/L (ref 20–31)
CREATININE: 0.64 mg/dL (ref 0.50–1.10)
Calcium: 8.6 mg/dL (ref 8.6–10.2)
Glucose, Bld: 80 mg/dL (ref 65–99)
Potassium: 3.5 mmol/L (ref 3.5–5.3)
SODIUM: 136 mmol/L (ref 135–146)
TOTAL PROTEIN: 6.3 g/dL (ref 6.1–8.1)

## 2015-12-19 LAB — TYPE AND SCREEN
ABO/RH(D): O POS
Antibody Screen: NEGATIVE

## 2015-12-19 MED ORDER — LACTATED RINGERS IV SOLN
INTRAVENOUS | Status: DC
  Administered 2015-12-19: 20:00:00 via INTRAVENOUS

## 2015-12-19 MED ORDER — FENTANYL CITRATE (PF) 100 MCG/2ML IJ SOLN
100.0000 ug | INTRAMUSCULAR | Status: DC | PRN
  Administered 2015-12-19 (×2): 100 ug via INTRAVENOUS
  Filled 2015-12-19: qty 2

## 2015-12-19 MED ORDER — LACTATED RINGERS IV SOLN: 500.0000 mL | INTRAVENOUS | Status: DC | PRN

## 2015-12-19 MED ORDER — LIDOCAINE HCL (PF) 1 % IJ SOLN
30.0000 mL | INTRAMUSCULAR | Status: DC | PRN
  Filled 2015-12-19: qty 30

## 2015-12-19 MED ORDER — PENICILLIN G POTASSIUM 5000000 UNITS IJ SOLR
2.5000 10*6.[IU] | INTRAVENOUS | Status: DC
  Filled 2015-12-19 (×4): qty 2.5

## 2015-12-19 MED ORDER — FENTANYL CITRATE (PF) 100 MCG/2ML IJ SOLN
INTRAMUSCULAR | Status: AC
  Administered 2015-12-19: 100 ug via INTRAVENOUS
  Filled 2015-12-19: qty 2

## 2015-12-19 MED ORDER — HYDRALAZINE HCL 20 MG/ML IJ SOLN: 10.0000 mg | Freq: Once | INTRAMUSCULAR | Status: DC | PRN

## 2015-12-19 MED ORDER — SOD CITRATE-CITRIC ACID 500-334 MG/5ML PO SOLN
30.0000 mL | ORAL | Status: DC | PRN
  Administered 2015-12-19: 30 mL via ORAL
  Filled 2015-12-19: qty 15

## 2015-12-19 MED ORDER — ACETAMINOPHEN 325 MG PO TABS: 650.0000 mg | ORAL_TABLET | ORAL | Status: DC | PRN

## 2015-12-19 MED ORDER — OXYTOCIN BOLUS FROM INFUSION: 500.0000 mL | Freq: Once | INTRAVENOUS | Status: DC

## 2015-12-19 MED ORDER — OXYCODONE-ACETAMINOPHEN 5-325 MG PO TABS: 2.0000 | ORAL_TABLET | ORAL | Status: DC | PRN

## 2015-12-19 MED ORDER — DEXTROSE 5 % IV SOLN
5.0000 10*6.[IU] | Freq: Once | INTRAVENOUS | Status: AC
  Administered 2015-12-19: 5 10*6.[IU] via INTRAVENOUS
  Filled 2015-12-19: qty 5

## 2015-12-19 MED ORDER — ONDANSETRON HCL 4 MG/2ML IJ SOLN
4.0000 mg | Freq: Four times a day (QID) | INTRAMUSCULAR | Status: DC | PRN
  Administered 2015-12-19: 4 mg via INTRAVENOUS
  Filled 2015-12-19: qty 2

## 2015-12-19 MED ORDER — OXYTOCIN 40 UNITS IN LACTATED RINGERS INFUSION - SIMPLE MED
2.5000 [IU]/h | INTRAVENOUS | Status: DC
  Filled 2015-12-19: qty 1000

## 2015-12-19 MED ORDER — LABETALOL HCL 5 MG/ML IV SOLN: 20.0000 mg | INTRAVENOUS | Status: DC | PRN

## 2015-12-19 MED ORDER — OXYCODONE-ACETAMINOPHEN 5-325 MG PO TABS: 1.0000 | ORAL_TABLET | ORAL | Status: DC | PRN

## 2015-12-19 NOTE — Progress Notes (Signed)
   PRENATAL VISIT NOTE  Subjective:  Amy Horn is a 40 y.o. [phone] at 1661w1d being seen today for ongoing prenatal care.  She is currently monitored for the following issues for this high-risk pregnancy and has Supervision of high risk pregnancy, antepartum; Multigravida of advanced maternal age; Previous cesarean delivery affecting pregnancy, antepartum; Obesity in pregnancy; Grand multiparity; and Hypertension in pregnancy, antepartum on her problem list.  Patient reports no complaints.  Contractions: Irregular. Vag. Bleeding: None.  Movement: (!) Decreased. Denies leaking of fluid.   The following portions of the patient's history were reviewed and updated as appropriate: allergies, current medications, past family history, past medical history, past social history, past surgical history and problem list. Problem list updated.  Objective:   Vitals:   12/19/15 1509  BP: (!) 151/98  Pulse: 77  Weight: 221 lb (100.2 kg)    Fetal Status:     Movement: (!) Decreased     General:  Alert, oriented and cooperative. Patient is in no acute distress.  Skin: Skin is warm and dry. No rash noted.   Cardiovascular: Normal heart rate noted  Respiratory: Normal respiratory effort, no problems with respiration noted  Abdomen: Soft, gravid, appropriate for gestational age. Pain/Pressure: Present     Pelvic:  Cervical exam performed      3/50/-2  Extremities: Normal range of motion.  Edema: Trace  Mental Status: Normal mood and affect. Normal behavior. Normal judgment and thought content.   Assessment and Plan:  Pregnancy: [id number] at 5861w1d  1. Gestational hypertension without significant proteinuria, antepartum -Pt has had 2nd elevated BP and several that are 139/89.  Pt qualifies for gestational hypertension.  I suggest induction.  Pt needs to get some things done tonight and would like to wait until the a.m.  She is asymptomatic.  NST reactive.   Will draw stat PIH labs.  Pt given strict  Pre E precaautions and [facility]    2. Supervision of high risk pregnancy, antepartum -GBS--recommend starting antibiotics upon admission.  Term labor symptoms and general obstetric precautions including but not limited to vaginal bleeding, contractions, leaking of fluid and fetal movement were reviewed in detail with the patient. Please refer to After Visit Summary for other counseling recommendations.  RTC 1 day for BP check and hopefully pt agrees to induction.    Lesly DukesKelly H Leggett, MD

## 2015-12-19 NOTE — H&P (Signed)
Amy AlbeeChristina R Horn is a 40 y.o. female [phone] @ 38.1wks by LMP and confirmed by 7wk scan presenting for reg ctx this evening. No continuous leaking of fluid; +bldy show. [facility] and has been remarkable for 1) gHTN dx @ 31wks 2) grandmultip 3) AMA 4) prev C/S- 3 prior vag del and 1 VBAC- desires VBAC this preg 5) GBS pos  OB History    Gravida Para Term Preterm AB Living   8 4 4   3 6    SAB TAB Ectopic Multiple Live Births   3     2 6      Past Medical History:  Diagnosis Date  . Vaginal Pap smear, abnormal    Hx of colpo, no further action needed, +HRHPV   Past Surgical History:  Procedure Laterality Date  . APPENDECTOMY    . CESAREAN SECTION  2007  . herniated disc  2015   L5  . TONSILLECTOMY     Family History: family history includes Cancer in her paternal grandmother; Heart disease in her mother; Hypertension in her father. Social History:  reports that she is a non-smoker but has been exposed to tobacco smoke. She has never used smokeless tobacco. She reports that she does not drink alcohol or use drugs.     Maternal Diabetes: No Genetic Screening: Declined Maternal Ultrasounds/Referrals: Normal Fetal Ultrasounds or other Referrals:  None Maternal Substance Abuse:  No Significant Maternal Medications:  None Significant Maternal Lab Results:  Lab values include: Group B Strep positive Other Comments:  None  ROS History Dilation: 4 Effacement (%): 80 Station: -2 Exam by:: B Mosca RN Blood pressure 147/93, pulse 83, temperature 98 F (36.7 C), temperature source Oral, resp. rate 18, last menstrual period 03/27/2015. Exam Physical Exam  Constitutional: She is oriented to person, place, and time. She appears well-developed.  HENT:  Head: Normocephalic.  Neck: Normal range of motion.  Cardiovascular: Normal rate.   Respiratory: Effort normal.  GI:  EFM 140-150s, +accels, occ mi variables Ctx q 3 mins  Musculoskeletal:  Normal range of motion.  Neurological: She is alert and oriented to person, place, and time.  Skin: Skin is warm and dry.  Psychiatric: She has a normal mood and affect. Her behavior is normal. Thought content normal.    Prenatal labs: ABO, Rh: O/POS/-- (05/12 1007) Antibody: NEG (05/12 1007) Rubella: 1.04 (05/12 1007) RPR: NON REAC (08/07 1551)  HBsAg: NEGATIVE (05/12 1007)  HIV: NONREACTIVE (08/07 1551)  GBS: Positive (10/10 0000)   CBC    Component Value Date/Time   WBC 12.8 (H) 12/19/2015 1614   RBC 4.50 12/19/2015 1614   HGB 12.0 12/19/2015 1614   HCT 36.0 12/19/2015 1614   PLT 378 12/19/2015 1614   MCV 80.0 12/19/2015 1614   MCH 26.7 (L) 12/19/2015 1614   MCHC 33.3 12/19/2015 1614   RDW 14.8 12/19/2015 1614   LYMPHSABS 2,646 07/13/2015 1007   MONOABS 1,029 (H) 07/13/2015 1007   EOSABS 735 (H) 07/13/2015 1007   BASOSABS 0 07/13/2015 1007   CMP     Component Value Date/Time   NA 136 12/19/2015 1614   K 3.5 12/19/2015 1614   CL 104 12/19/2015 1614   CO2 22 12/19/2015 1614   GLUCOSE 80 12/19/2015 1614   BUN 6 (L) 12/19/2015 1614   CREATININE 0.64 12/19/2015 1614   CALCIUM 8.6 12/19/2015 1614   PROT 6.3 12/19/2015 1614   ALBUMIN 3.2 (L) 12/19/2015 1614   AST  13 12/19/2015 1614   ALT 11 12/19/2015 1614   ALKPHOS 116 (H) 12/19/2015 1614   BILITOT 0.5 12/19/2015 1614   Assessment/Plan: IUP@38 .1wks Latent labor gHTN GBS pos Plans TOLAC- consent 10/29/15  Ad[facility] to [facility] Expectant management PCN for GBS ppx Urine P/C ratio  Pending Plans IV pain meds for labor  Anticipate SVD  Cam Hai CNM 12/19/2015, 9:05 PM

## 2015-12-19 NOTE — MAU Note (Signed)
Pt reports painful uc's since 5pm. Denies leaking or bleeding. Pt reports + FM, but decreased

## 2015-12-20 ENCOUNTER — Encounter: Payer: Self-pay | Admitting: Obstetrics & Gynecology

## 2015-12-20 ENCOUNTER — Encounter (HOSPITAL_COMMUNITY): Payer: Self-pay

## 2015-12-20 LAB — PROTEIN / CREATININE RATIO, URINE
Creatinine, Urine: 84 mg/dL (ref 20–320)
Protein Creatinine Ratio: 95 mg/g creat (ref 21–161)
TOTAL PROTEIN, URINE: 8 mg/dL (ref 5–24)

## 2015-12-20 LAB — ABO/RH: ABO/RH(D): O POS

## 2015-12-20 LAB — RPR: RPR Ser Ql: NONREACTIVE

## 2015-12-20 MED ORDER — ONDANSETRON HCL 4 MG/2ML IJ SOLN: 4.0000 mg | INTRAMUSCULAR | Status: DC | PRN

## 2015-12-20 MED ORDER — TETANUS-DIPHTH-ACELL PERTUSSIS 5-2.5-18.5 LF-MCG/0.5 IM SUSP: 0.5000 mL | Freq: Once | INTRAMUSCULAR | Status: DC

## 2015-12-20 MED ORDER — ACETAMINOPHEN 325 MG PO TABS
650.0000 mg | ORAL_TABLET | ORAL | Status: DC | PRN
  Administered 2015-12-20: 650 mg via ORAL
  Filled 2015-12-20: qty 2

## 2015-12-20 MED ORDER — COCONUT OIL OIL: 1.0000 "application " | TOPICAL_OIL | Status: DC | PRN

## 2015-12-20 MED ORDER — IBUPROFEN 600 MG PO TABS
600.0000 mg | ORAL_TABLET | Freq: Four times a day (QID) | ORAL | Status: DC
  Administered 2015-12-20 – 2015-12-21 (×7): 600 mg via ORAL
  Filled 2015-12-20 (×7): qty 1

## 2015-12-20 MED ORDER — DIPHENHYDRAMINE HCL 25 MG PO CAPS: 25.0000 mg | ORAL_CAPSULE | Freq: Four times a day (QID) | ORAL | Status: DC | PRN

## 2015-12-20 MED ORDER — ONDANSETRON HCL 4 MG PO TABS: 4.0000 mg | ORAL_TABLET | ORAL | Status: DC | PRN

## 2015-12-20 MED ORDER — WITCH HAZEL-GLYCERIN EX PADS: 1.0000 "application " | MEDICATED_PAD | CUTANEOUS | Status: DC | PRN

## 2015-12-20 MED ORDER — PRENATAL MULTIVITAMIN CH
1.0000 | ORAL_TABLET | Freq: Every day | ORAL | Status: DC
  Administered 2015-12-20 – 2015-12-21 (×2): 1 via ORAL
  Filled 2015-12-20 (×3): qty 1

## 2015-12-20 MED ORDER — ZOLPIDEM TARTRATE 5 MG PO TABS: 5.0000 mg | ORAL_TABLET | Freq: Every evening | ORAL | Status: DC | PRN

## 2015-12-20 MED ORDER — SIMETHICONE 80 MG PO CHEW: 80.0000 mg | CHEWABLE_TABLET | ORAL | Status: DC | PRN

## 2015-12-20 MED ORDER — DIBUCAINE 1 % RE OINT: 1.0000 "application " | TOPICAL_OINTMENT | RECTAL | Status: DC | PRN

## 2015-12-20 MED ORDER — SENNOSIDES-DOCUSATE SODIUM 8.6-50 MG PO TABS
2.0000 | ORAL_TABLET | ORAL | Status: DC
  Administered 2015-12-20: 2 via ORAL
  Filled 2015-12-20: qty 2

## 2015-12-20 MED ORDER — BENZOCAINE-MENTHOL 20-0.5 % EX AERO: 1.0000 "application " | INHALATION_SPRAY | CUTANEOUS | Status: DC | PRN

## 2015-12-20 NOTE — Progress Notes (Addendum)
Pt complaining of a headache. No blurred vision or epigastric pain. BP 135/74.

## 2015-12-20 NOTE — Lactation Note (Addendum)
This note was copied from a baby's chart. Lactation Consultation Note Experienced BF mom. This is mom 7th child. She BF all of her children 6-8 weeks. Mom's oldest is 22 yr ols, then has 2 sets of twins 41 yrs old and 41 yrs old, then has a 179 yr old. Mom denies any difficulty BF. Baby latched well and Bf well. Baby came off after falling a sleep. STS and covered on mom. Low bld sugar 29. Hand expression 8ml colostrum. Mom has large pendulum breast w/everted nipple. Hand pump given to mom. Mom and baby slept for short time. Baby's blood sugar low, glucose given by RN. Baby STS. Formula attempted by RN by finger feeding and curve tip syring. Wouldn't take but 1 ml. This LC attempted bottle feeding. Has no suck swallow coordination or interest in feeding. spoon fed 5 ml. With a lot of work to get baby to take it. Gave back to mom who is very tired. Reported to RN.Mom encouraged to feed baby 8-12 times/24 hours and with feeding cues. Referred to Baby and Me Book in Breastfeeding section Pg. 22-23 for position options and Proper latch demonstration.WH/LC brochure given w/resources, support groups and LC services. Earlier before mom fell asleep reviewed newborn behavior and care.  Patient Name: Boy Alinda DoomsChristina Rickert ZOXWR'UToday's Date: 12/20/2015 Reason for consult: Initial assessment   Maternal Data Has patient been taught Hand Expression?: Yes Does the patient have breastfeeding experience prior to this delivery?: Yes  Feeding Feeding Type: Breast Fed Length of feed: 10 min  LATCH Score/Interventions Latch: Grasps breast easily, tongue down, lips flanged, rhythmical sucking. Intervention(s): Skin to skin;Teach feeding cues;Waking techniques Intervention(s): Breast massage;Breast compression  Audible Swallowing: A few with stimulation Intervention(s): Skin to skin;Hand expression Intervention(s): Skin to skin;Hand expression  Type of Nipple: Everted at rest and after stimulation  Comfort  (Breast/Nipple): Soft / non-tender     Hold (Positioning): Assistance needed to correctly position infant at breast and maintain latch. Intervention(s): Breastfeeding basics reviewed;Support Pillows;Skin to skin;Position options  LATCH Score: 8  Lactation Tools Discussed/Used WIC Program: No Pump Review: Setup, frequency, and cleaning;Milk Storage Initiated by:: L> Perri Aragones RN IBCLC Date initiated:: 12/20/15   Consult Status Consult Status: Follow-up Date: 12/20/15 Follow-up type: In-patient    Jarnell Cordaro, Diamond NickelLAURA G 12/20/2015, 5:12 AM

## 2015-12-20 NOTE — Lactation Note (Addendum)
This note was copied from a baby's chart. Lactation Consultation Note  Patient Name: Amy Horn ZOXWR'UToday's Date: 12/20/2015 Reason for consult: NICU baby  I provided Mom with NICU booklet, pointing out to her pumping guidelines, pumping log, and milk storage instructions. Yellow stickers & colostrum containers provided w/instructions for use.   Mom has no questions or concerns at this time. She is currently visiting with FOB & 3 of her older children.  Lurline HareRichey, Oswell Say [facility] 12/20/2015, 7:58 PM

## 2015-12-20 NOTE — Progress Notes (Signed)
Patient ID: Amy Horn, female   DOB: 1975-11-11, 40 y.o.   MRN: [id number]  POSTPARTUM PROGRESS NOTE  Post Partum Day 1  Subjective:  Amy Horn is a 40 y.o. [id number] 6119w1d s/p VBAC yesterday evening.  No acute events overnight.  Pt denies problems with ambulating, voiding or po intake.  She denies nausea or vomiting.  Pain is well controlled.  She has not had flatus. She has not had bowel movement.  Lochia Minimal. Reports having a minor headache that improved with Tylenol.  Objective: Blood pressure 124/74, pulse 83, temperature 98 F (36.7 C), temperature source Oral, resp. rate 18, height 5\' 9"  (1.753 m), weight 98.9 kg (218 lb), last menstrual period 03/27/2015, SpO2 98 %, unknown if currently breastfeeding.  Physical Exam:  General: alert, cooperative and no distress Lochia:normal flow Chest: CTAB Heart: RRR no m/r/g Abdomen: +BS, soft, nontender,  Uterine Fundus: firm, below level of umbilicus DVT Evaluation: No calf swelling or tenderness Extremities: without edema   Recent Labs  12/19/15 1614 12/19/15 2103  HGB 12.0 12.1  HCT 36.0 35.8*    Assessment/Plan:  ASSESSMENT: Amy Horn is a 40 y.o. [id number] 2719w1d s/p VBAC  Plan for discharge tomorrow    LOS: 1 day   Tillman Sersngela C Riccio, DO 12/20/2015, 7:25 AM   CNM attestation Post Partum Day #2 I have seen and examined this patient and agree with above documentation in the resident's note.   Amy Horn is a 40 y.o. [id number] s/p VBAC.  Pt denies problems with ambulating, voiding or po intake. Pain is well controlled.  Plan for birth control is IUD.  Method of Feeding: breast  PE:  BP 124/74 (BP Location: Right Arm)   Pulse 83   Temp 98 F (36.7 C) (Oral)   Resp 18   Ht 5\' 9"  (1.753 m)   Wt 98.9 kg (218 lb)   LMP 03/27/2015 (Approximate)   SpO2 98%   Breastfeeding? Unknown   BMI 32.19 kg/m  Fundus firm  Plan for discharge: 12/21/15  Cam HaiSHAW, KIMBERLY, CNM 7:54 AM   12/20/2015

## 2015-12-21 ENCOUNTER — Ambulatory Visit: Payer: Self-pay

## 2015-12-21 MED ORDER — SENNOSIDES-DOCUSATE SODIUM 8.6-50 MG PO TABS: 2.0000 | ORAL_TABLET | ORAL | 0 refills | Status: DC

## 2015-12-21 MED ORDER — IBUPROFEN 600 MG PO TABS: 600.0000 mg | ORAL_TABLET | Freq: Four times a day (QID) | ORAL | 0 refills | Status: DC

## 2015-12-21 NOTE — Discharge Summary (Signed)
OB Discharge Summary     Patient Name: Amy Horn DOB: 11-17-1975 MRN: [id number]  Date of admission: 12/19/2015 Delivering MD: Cam Hai D   Date of discharge: 12/21/2015  Admitting diagnosis: 38w labor, ctx 4-6 min Intrauterine pregnancy: [redacted]w[redacted]d     Secondary diagnosis:  Active Problems:   Gestational hypertension, third trimester  Additional problems: GBS positive, advanced maternal age, previous C-section, obesity     Discharge diagnosis: Term Pregnancy Delivered, VBAC and Gestational Hypertension                                                                                 Post partum procedures:none  Augmentation: none  Complications: None  Hospital course:  Onset of Labor With Vaginal Delivery     40 y.o. yo [id number] at [redacted]w[redacted]d was admitted in Active Labor on 12/19/2015. Patient had an uncomplicated labor course as follows:  Membrane Rupture Time/Date: 11:25 PM ,12/19/2015   Intrapartum Procedures: Episiotomy: None [1]                                         Lacerations:  None [1]  Patient had a delivery of a Viable infant. 12/19/2015  Information for the patient's newborn:  Ranell, Finelli [[id number]]  Delivery Method: VBAC, Spontaneous (Filed from Delivery Summary)    Pateint had an uncomplicated postpartum course.  She is ambulating, tolerating a regular diet, passing flatus, and urinating well. Patient is discharged home in stable condition on 12/21/15.    Physical exam Vitals:   12/20/15 1115 12/20/15 1539 12/20/15 2045 12/21/15 0513  BP: (!) 150/84 132/74 138/87 128/76  Pulse: 92 81 73 64  Resp: 18  18 16   Temp: 98.9 F (37.2 C)  98.3 F (36.8 C) 98.4 F (36.9 C)  TempSrc: Oral  Oral Oral  SpO2:      Weight:      Height:       General: alert, cooperative and no distress Lochia: appropriate Uterine Fundus: firm Incision: N/A DVT Evaluation: No evidence of DVT seen on physical exam. Labs: Lab Results  Component Value Date   WBC  15.6 (H) 12/19/2015   HGB 12.1 12/19/2015   HCT 35.8 (L) 12/19/2015   MCV 80.4 12/19/2015   PLT 362 12/19/2015   CMP Latest Ref Rng & Units 12/19/2015  Glucose 65 - 99 mg/dL 80  BUN 7 - 25 mg/dL 6(L)  Creatinine 1.30 - 1.10 mg/dL 8.65  Sodium 784 - 696 mmol/L 136  Potassium 3.5 - 5.3 mmol/L 3.5  Chloride 98 - 110 mmol/L 104  CO2 20 - 31 mmol/L 22  Calcium 8.6 - 10.2 mg/dL 8.6  Total Protein 6.1 - 8.1 g/dL 6.3  Total Bilirubin 0.2 - 1.2 mg/dL 0.5  Alkaline Phos 33 - 115 U/L 116(H)  AST 10 - 30 U/L 13  ALT 6 - 29 U/L 11    Discharge instruction: per After Visit Summary and "Baby and Me Booklet".  After visit meds:    Medication List    TAKE these medications   calcium carbonate 500 MG chewable  tablet Commonly known as:  TUMS - dosed in mg elemental calcium Chew 2 tablets by mouth as needed for indigestion or heartburn.   ibuprofen 600 MG tablet Commonly known as:  ADVIL,MOTRIN Take 1 tablet (600 mg total) by mouth every 6 (six) hours.   PRENATE DHA 18-0.6-0.4-300 MG Caps Take 1 capsule by mouth daily.   senna-docusate 8.6-50 MG tablet Commonly known as:  Senokot-S Take 2 tablets by mouth daily. Start taking on:  12/22/2015       Diet: routine diet  Activity: Advance as tolerated. Pelvic rest for 6 weeks.   Outpatient follow up:6 weeks Follow up Appt:No future appointments. Follow up Visit:No Follow-up on file.  Postpartum contraception: IUD Mirena  Newborn Data: Live born female  Birth Weight: 5 lb 14.5 oz (2680 g) APGAR: 9, 9  Baby Feeding: Breast Disposition:NICU   12/21/2015 Tillman SersAngela C Riccio, DO    OB FELLOW DISCHARGE ATTESTATION  I have seen and examined this patient and agree with above documentation in the resident's note.   Jen MowElizabeth Casen Pryor, DO OB Fellow 9:57 AM

## 2015-12-21 NOTE — Discharge Instructions (Signed)

## 2015-12-21 NOTE — Lactation Note (Signed)
This note was copied from a baby's chart. Lactation Consultation Note  Patient Name: [name] ZDGUY'QToday's Date: 12/21/2015   Mom seen prior to discharge.  She has been obtaining a few mls of colostrum using the symphony pump.  She states baby will be discharged in 24-48 hrs.  Mom plans on using manual pump at home and symphony pump when coming to NICU.  She does not have WIC.  Discussed pump rental and she will let us know if she is interested.  Encouraged to call with concerns or latch assist.   Maternal Data    Feeding Feeding Type: Bottle Fed - Formula Nipple Type: Slow - flow Length of feed: 20 min  LATCH Score/Interventions                      Lactation Tools Discussed/Used     Consult Status      Huston Horn, Amy Remmert S 12/21/2015, 12:38 PM

## 2015-12-21 NOTE — Progress Notes (Signed)
CSW acknowledges NICU admission.    Patient screened out for psychosocial assessment since none of the following apply:  Psychosocial stressors documented in mother or baby's chart  Gestation less than 32 weeks  Code at delivery   Infant with anomalies  Please contact the Clinical Social Worker if specific needs arise, or by MOB's request.       

## 2015-12-21 NOTE — Progress Notes (Signed)
Pt discharged home ambulatory in stable condition.  Discharge instructions reviewed, pt understanding verbalized, and copy given.  Pt accompanied off unit with family.

## 2015-12-25 ENCOUNTER — Ambulatory Visit: Payer: Medicaid Other | Admitting: Obstetrics & Gynecology

## 2015-12-25 ENCOUNTER — Ambulatory Visit: Payer: Self-pay

## 2015-12-25 NOTE — Lactation Note (Addendum)
This note was copied from a baby's chart. Lactation Consultation Note  Patient Name: Boy Alinda DoomsChristina Cudd ZOXWR'UToday's Date: 12/25/2015 Reason for consult: Follow-up assessment;NICU baby;Infant < 6lbs   Follow up with mom at infant's bedside. Mom reports she is pumping about every 3 hours. She is using DEBP in NICU and manual pump at home. She reports she is trying to get a DEBP., hopefully tomorrow. She reports she is pumping about 2 oz per pumping. She reports this is her 7th child and she BF all her children for varying lengths. She reports she is putting this child to breast and he nuzzles and goes to sleep.   Informed mom of 2 week breastpump rental, she will let us know if she is interested. Enc mom to call with questions/concerns or if pump rental is desired.   Mom reports she has had a HA for 2 days and that she has BP issues at delivery, advised mom to call her OB to report HA. Mom voiced understanding.    Maternal Data    Feeding Feeding Type: Breast Milk with Formula added Nipple Type: Slow - flow Length of feed: 40 min  LATCH Score/Interventions                      Lactation Tools Discussed/Used WIC Program: No Pump Review: Setup, frequency, and cleaning   Consult Status Consult Status: PRN Follow-up type: Call as needed    Ed BlalockSharon S Jeryl Umholtz 12/25/2015, 4:51 PM

## 2015-12-28 ENCOUNTER — Ambulatory Visit: Payer: Self-pay

## 2015-12-28 NOTE — Lactation Note (Signed)
This note was copied from a baby's chart. Lactation Consultation Note  Patient Name: Amy Horn PYYFR'T Date: 12/28/2015  Met with mom in the NICU to discuss concerns.  Baby is 91 days old and mom has been using a hand pump at home and just received a DEBP.  She states she has not been pumping often and none during the night.  She obtains 15 mls from right breast and 30 mls from left breast.  Baby is also latching some.  Plan is for mom to room in tonight with baby.  Discussed importance of post pumping 8-12 times/24 hours to increase milk supply.  Encouraged to call for assist/concerns.   Maternal Data    Feeding Feeding Type: Breast Fed  LATCH Score/Interventions                      Lactation Tools Discussed/Used     Consult Status      Ave Filter 12/28/2015, 2:00 PM

## 2016-04-30 ENCOUNTER — Ambulatory Visit (INDEPENDENT_AMBULATORY_CARE_PROVIDER_SITE_OTHER): Payer: Medicaid Other | Admitting: Obstetrics & Gynecology

## 2016-04-30 ENCOUNTER — Encounter: Payer: Self-pay | Admitting: Obstetrics & Gynecology

## 2016-04-30 VITALS — BP 134/90 | HR 76 | Ht 69.0 in | Wt 204.0 lb

## 2016-04-30 DIAGNOSIS — Z30430 Encounter for insertion of intrauterine contraceptive device: Secondary | ICD-10-CM

## 2016-04-30 DIAGNOSIS — Z975 Presence of (intrauterine) contraceptive device: Secondary | ICD-10-CM

## 2016-04-30 DIAGNOSIS — Z3202 Encounter for pregnancy test, result negative: Secondary | ICD-10-CM | POA: Diagnosis not present

## 2016-04-30 DIAGNOSIS — Z30433 Encounter for removal and reinsertion of intrauterine contraceptive device: Secondary | ICD-10-CM

## 2016-04-30 DIAGNOSIS — I10 Essential (primary) hypertension: Secondary | ICD-10-CM | POA: Insufficient documentation

## 2016-04-30 LAB — POCT URINE PREGNANCY: PREG TEST UR: NEGATIVE

## 2016-04-30 MED ORDER — LEVONORGESTREL 18.6 MCG/DAY IU IUD
INTRAUTERINE_SYSTEM | Freq: Once | INTRAUTERINE | Status: AC
  Administered 2016-04-30: 09:00:00 via INTRAUTERINE

## 2016-04-30 NOTE — Progress Notes (Signed)
Post Partum Exam  Amy AlbeeChristina R Horn is a 41 y.o. [id number] female who presents for a postpartum visit. She is 4 months postpartum following a spontaneous vaginal delivery. I have fully reviewed the prenatal and intrapartum course. The delivery was at 38 gestational weeks.  Anesthesia: none. Postpartum course has been unremarkable. Baby's course has been unremarkable. Baby is feeding by bottle. Bleeding no bleeding. Bowel function is normal. Bladder function is normal. Patient is not sexually active. Contraception method is IUD. Postpartum depression screening:neg (score 3).   The following portions of the patient's history were reviewed and updated as appropriate: allergies, current medications, past family history, past medical history, past social history, past surgical history and problem list.  Review of Systems Pertinent items are noted in HPI.    Objective:  unknown if currently breastfeeding.  General:  alert, cooperative and no distress   Breasts:  negative  Lungs: clear to auscultation bilaterally  Heart:  regular rate and rhythm  Abdomen: soft, non-tender; bowel sounds normal; no masses,  no organomegaly   Vulva:  normal  Vagina: normal vagina, no discharge, exudate, lesion, or erythema  Cervix:  no lesions  Corpus: normal size, contour, position, consistency, mobility, non-tender  Adnexa:  no mass, fullness, tenderness  Rectal Exam: Not performed.        Assessment:    nml 6 month postpartum exam.   Plan:   1. Contraception: IUD 2. Pap up to date.  Needs mammogram (ordered today) 3. Follow up in: 1 year or as needed.  4.  Follow up PCP for borderline HTN.  IUD Procedure Note Patient identified, informed consent performed.  Discussed risks of irregular bleeding, cramping, infection, malpositioning or misplacement of the IUD outside the uterus which may require further procedures. Time out was performed.  Urine pregnancy test negative.  Speculum placed in the vagina.   Cervix visualized.  Cleaned with Betadine x 2.  Grasped anteriorly with a single tooth tenaculum.  Uterus sounded to 8.75 cm.  Liletta IUD placed per manufacturer's recommendations.  Strings trimmed to 3 cm. Tenaculum was removed, good hemostasis noted.  Patient tolerated procedure well.   Patient was given post-procedure instructions and the Liletta care card with expiration date.  Patient was also asked to check IUD strings periodically and follow up in 4-6 weeks for IUD check.

## 2016-05-12 ENCOUNTER — Encounter: Payer: Self-pay | Admitting: Obstetrics & Gynecology

## 2016-06-08 IMAGING — US US MFM OB DETAIL+14 WK
1 series · 14 of 28 positions shown · non-contrast
Comparison: none

[Series 1: us mfm ob detail+14 wk · 73 acquisitions, 14 frames shown]
[im 3/73]
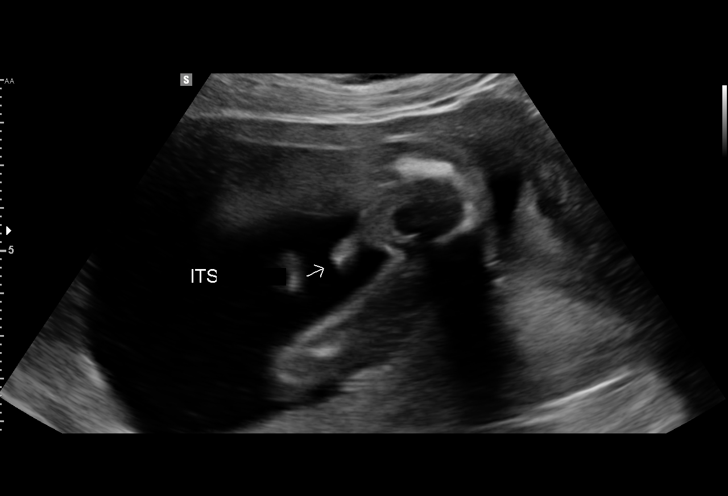
[im 9/73]
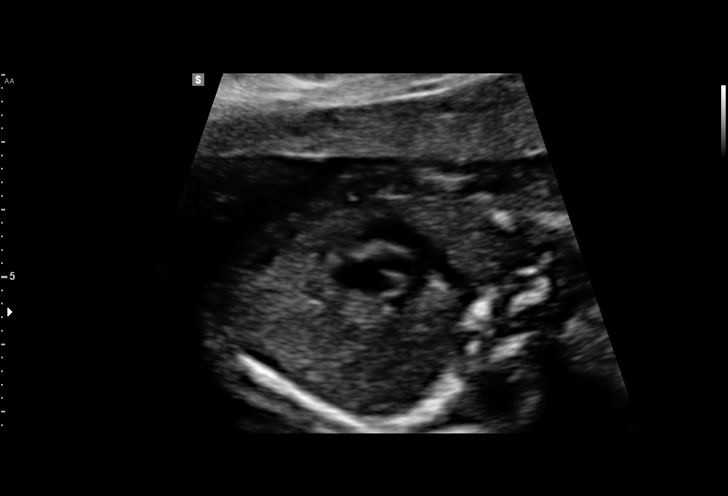
[im 14/73]
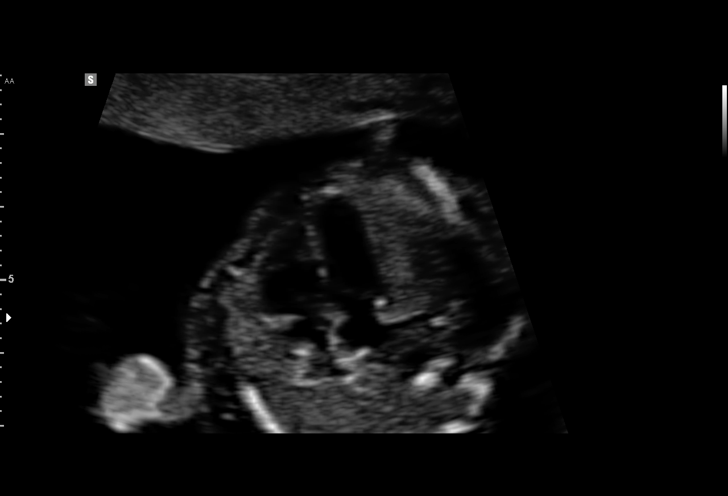
[im 19/73]
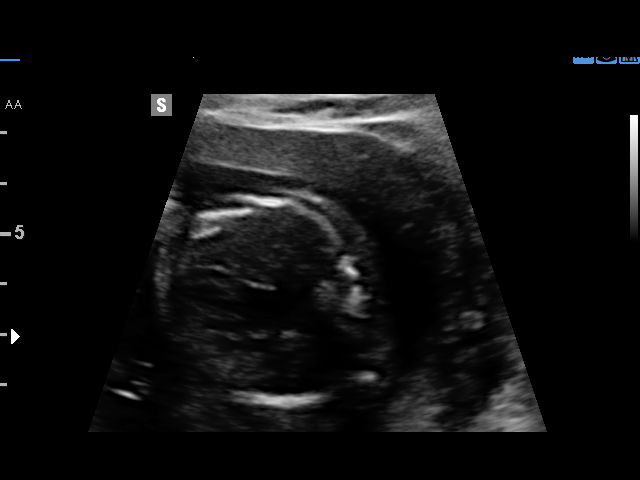
[im 25/73]
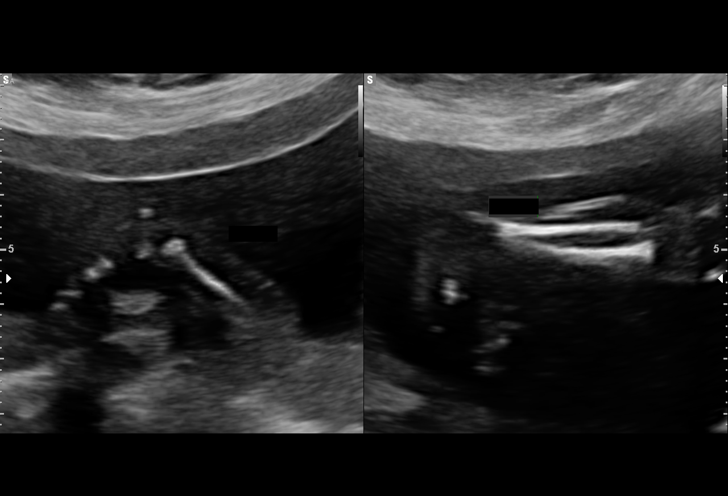
[im 30/73]
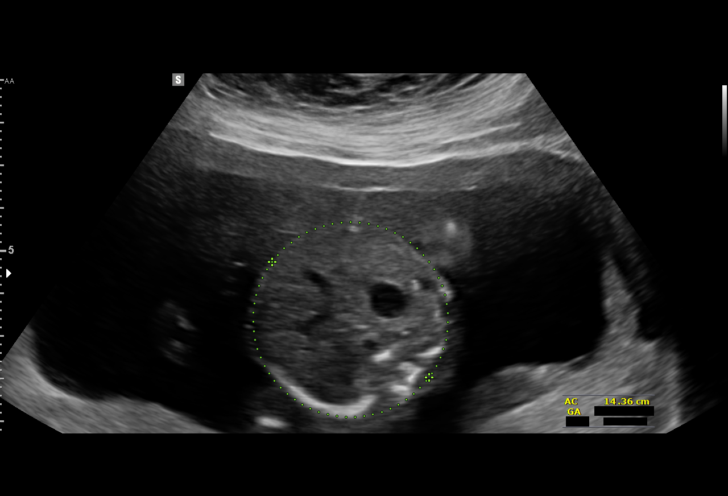
[im 35/73]
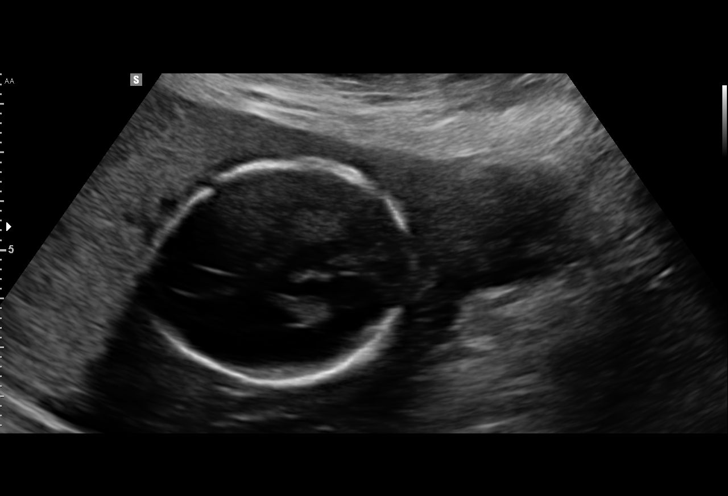
[im 41/73]
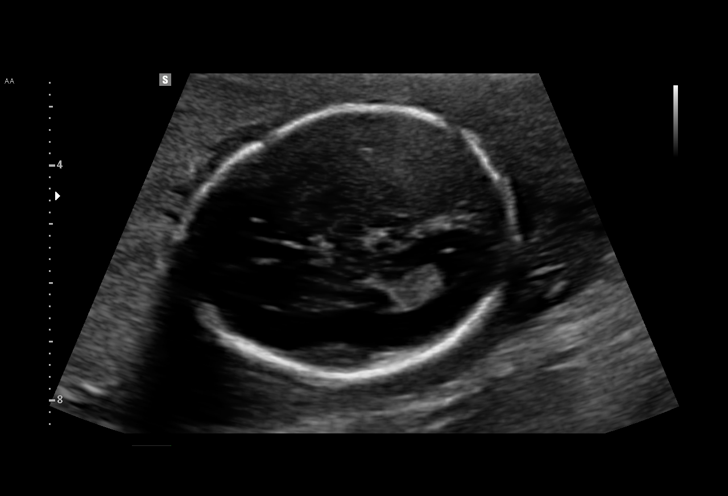
[im 46/73]
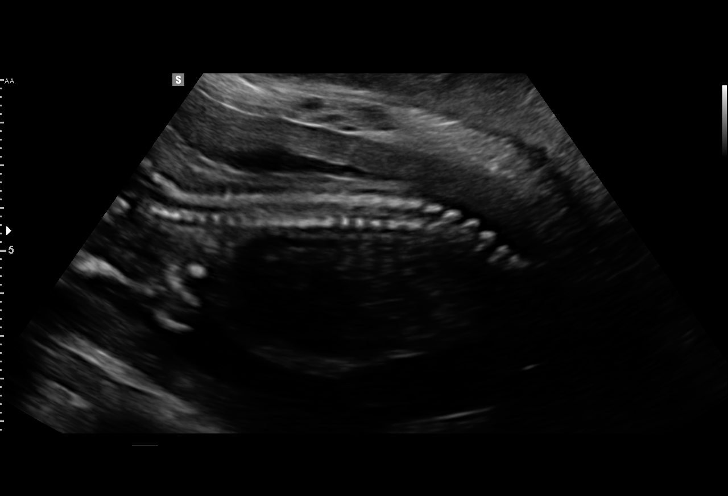
[im 51/73]
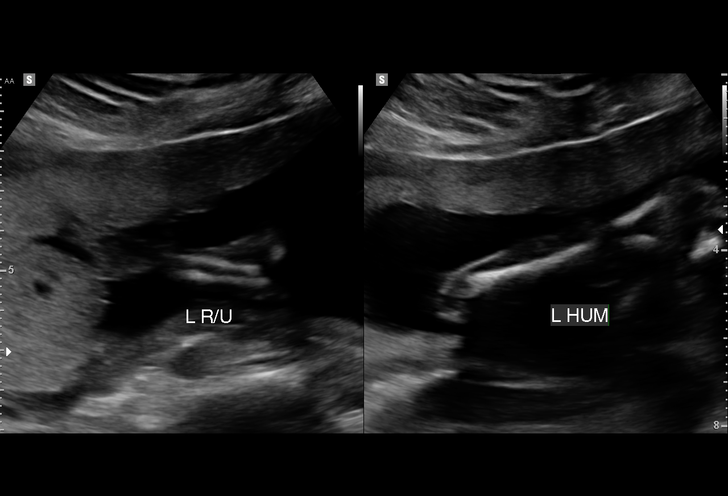
[im 57/73]
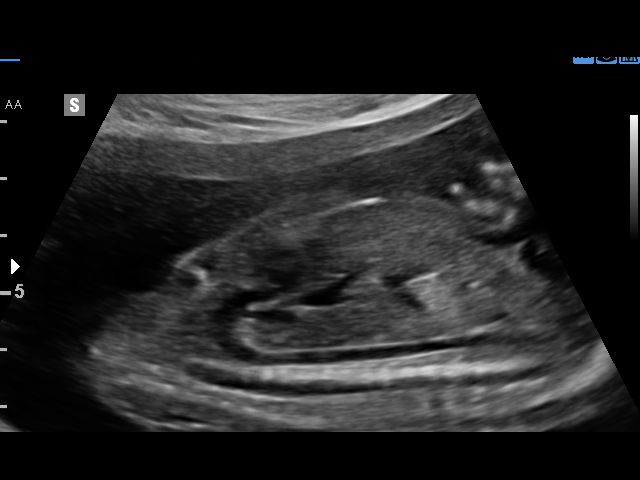
[im 62/73]
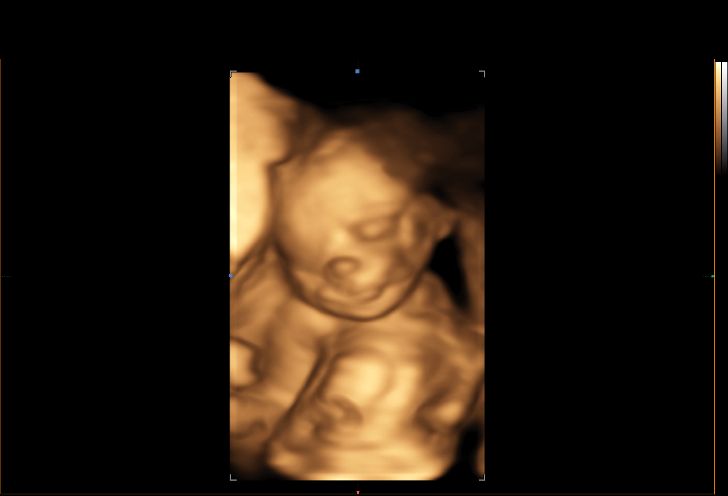
[im 67/73]
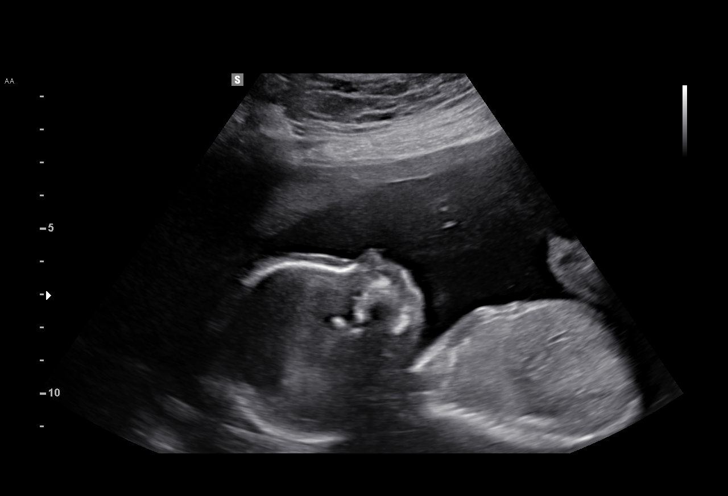
[im 73/73]
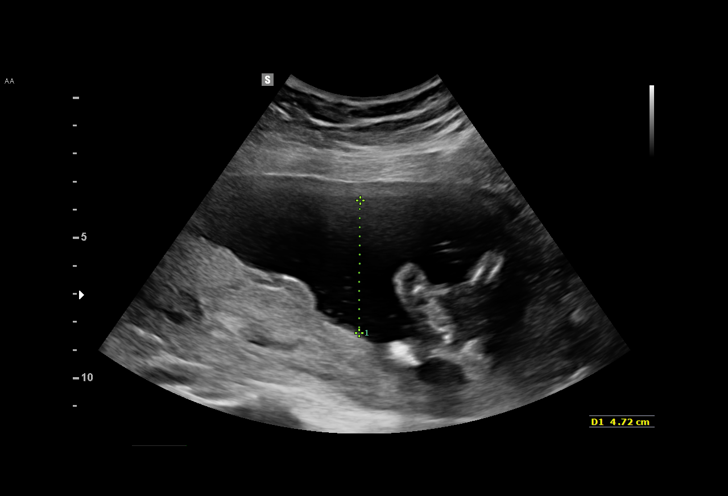

[14 of 28 positions shown; findings below may reference images not displayed]

1  PAULUS N CEEJAY            [id number]      [id number]     [id number]
Indications

19 weeks gestation of pregnancy
Detailed fetal anatomic survey                 Z36
Advanced maternal age multigravida 35+,
second trimester; declined RATAS and testing
Previous cesarean delivery, antepartum
OB History

Blood Type:            Height:         Weight (lb):  206.8    BMI:
Gravidity:    8         Term:   4         SAB:   3
Living:       6
Fetal Evaluation

Num Of Fetuses:     1
Fetal Heart         151
Rate(bpm):
Cardiac Activity:   Observed
Presentation:       Breech
Placenta:           Right lateral, above cervical os
P. Cord Insertion:  Not well visualized

Amniotic Fluid
AFI FV:      Subjectively within normal limits
Biometry

BPD:      44.3  mm     G. Age:  19w 3d         55  %    CI:        74.99   %   70 - 86
FL/HC:      18.5   %   16.1 -
HC:      162.3  mm     G. Age:  19w 0d         30  %    HC/AC:      1.12       1.09 -
AC:      144.3  mm     G. Age:  19w 5d         61  %    FL/BPD:     67.7   %
FL:         30  mm     G. Age:  19w 2d         42  %    FL/AC:      20.8   %   20 - 24
HUM:      30.1  mm     G. Age:  20w 0d         67  %
CER:      19.7  mm     G. Age:  18w 6d         40  %
NFT:       1.9  mm
CM:        5.3  mm

Est. FW:     295  gm    0 lb 10 oz      50  %
Gestational Age

LMP:           19w 2d       Date:   03/27/15                 EDD:   01/01/16
U/S Today:     19w 3d                                        EDD:   12/31/15
Best:          19w 2d    Det. By:   LMP  (03/27/15)          EDD:   01/01/16
Anatomy

Cranium:               Appears normal         Aortic Arch:            Appears normal
Cavum:                 Appears normal         Ductal Arch:            Appears normal
Ventricles:            Appears normal         Diaphragm:              Appears normal
Choroid Plexus:        Appears normal         Stomach:                Appears normal, left
sided
Cerebellum:            Appears normal         Abdomen:                Appears normal
Posterior Fossa:       Appears normal         Abdominal Wall:         Appears nml (cord
insert, abd wall)
Nuchal Fold:           Appears normal         Cord Vessels:           Appears normal (3
vessel cord)
Face:                  Appears normal         Kidneys:                Appear normal
(orbits and profile)
Lips:                  Appears normal         Bladder:                Appears normal
Thoracic:              Appears normal         Spine:                  Appears normal
Heart:                 Appears normal         Upper Extremities:      Appears normal
(4CH, axis, and
situs)
RVOT:                  Appears normal         Lower Extremities:      Appears normal
LVOT:                  Appears normal

Other:  Fetus appears to be a male. Heels and 5th digit visualized.
Cervix Uterus Adnexa

Cervix
Length:            5.2  cm.
Normal appearance by transabdominal scan.
Impression

SIUP at 19+2 weeks
Normal detailed fetal anatomy
Markers of aneuploidy: none
Normal amniotic fluid volume
Measurements consistent with LMP dating
Recommendations

Follow-up ultrasound for growth at 
28 and 
34 weeks (AMA)
Antenatal testing at 36 weeks (40 y/o or >)

## 2016-08-31 IMAGING — US US MFM OB FOLLOW-UP
1 series · 14 of 28 positions shown · non-contrast
Comparison: none

[Series 1: us mfm ob follow-up · 14 of 65 slices shown]
[im 3/65]
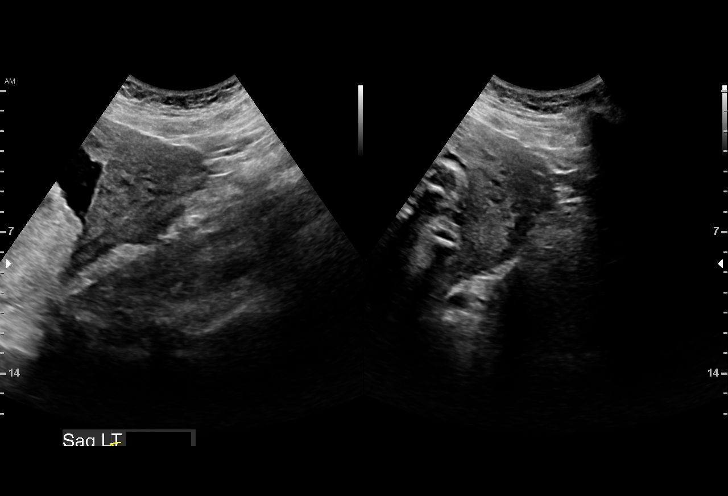
[im 8/65]
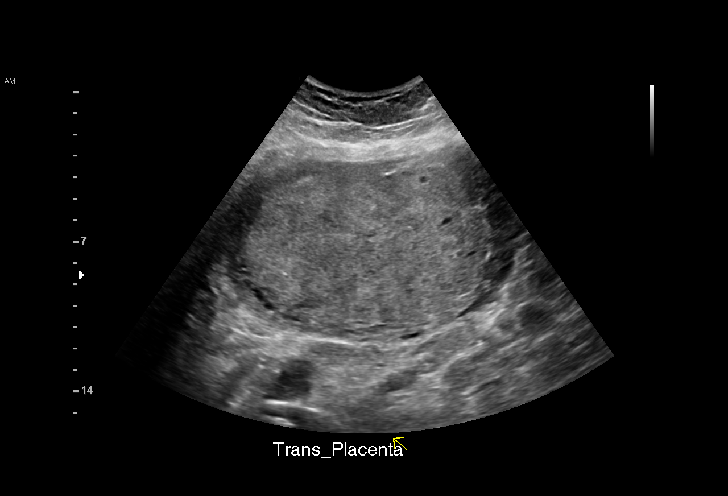
[im 12/65]
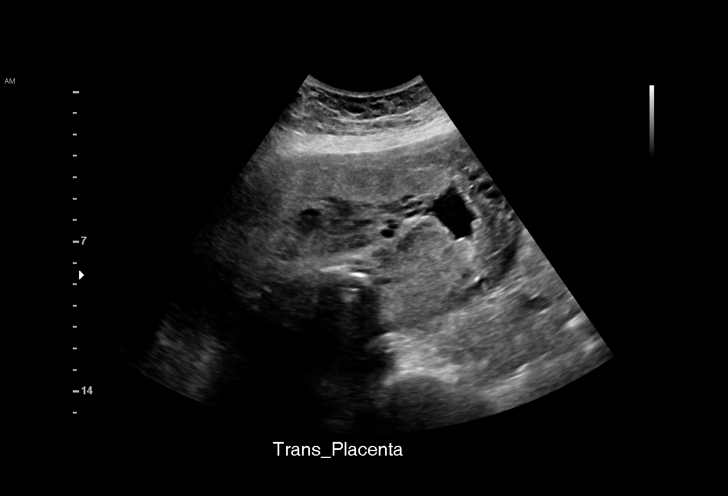
[im 17/65]
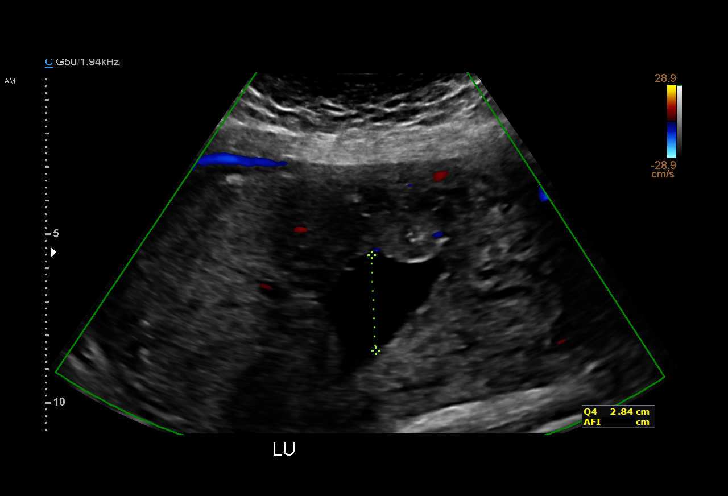
[im 22/65]
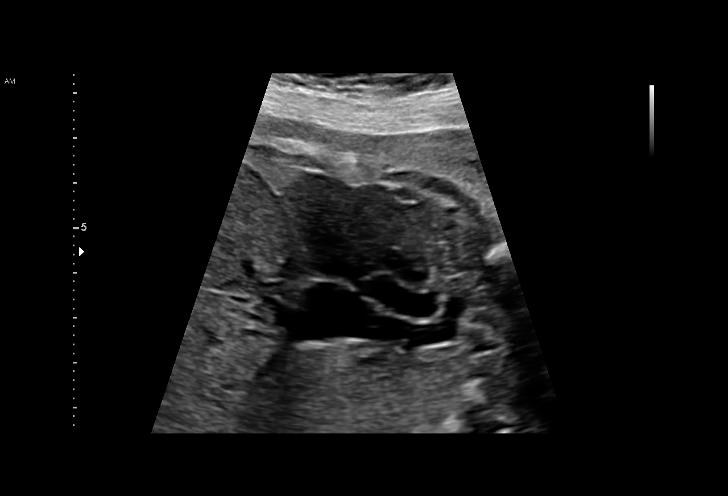
[im 27/65]
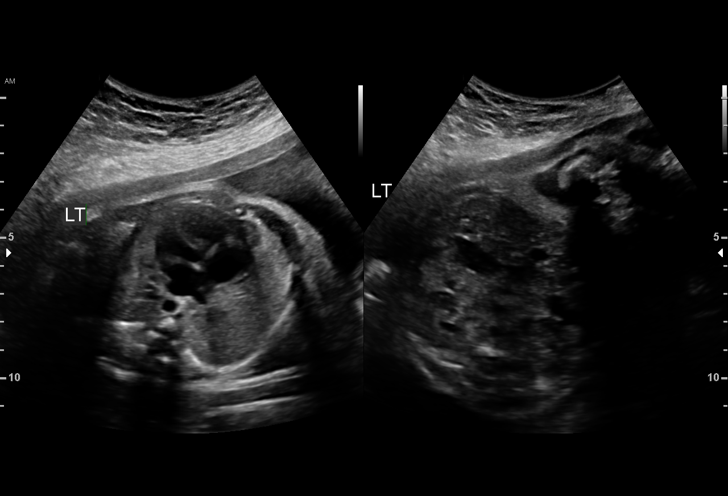
[im 31/65]
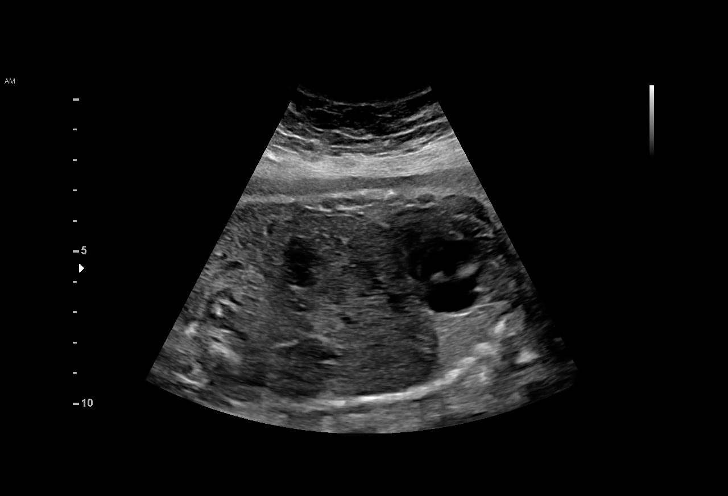
[im 36/65]
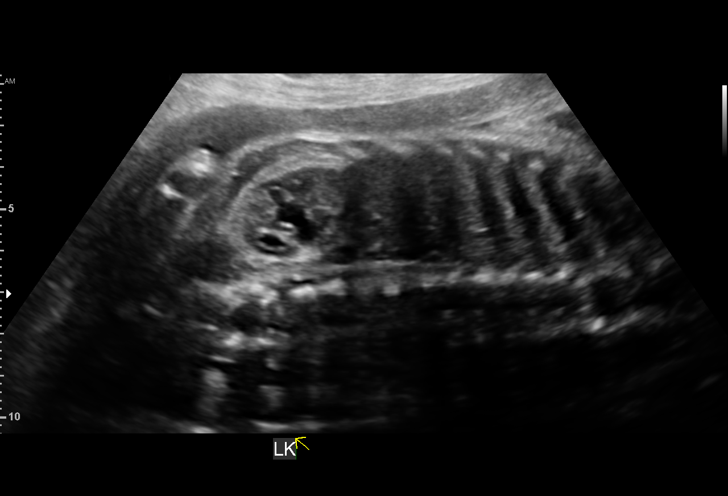
[im 41/65]
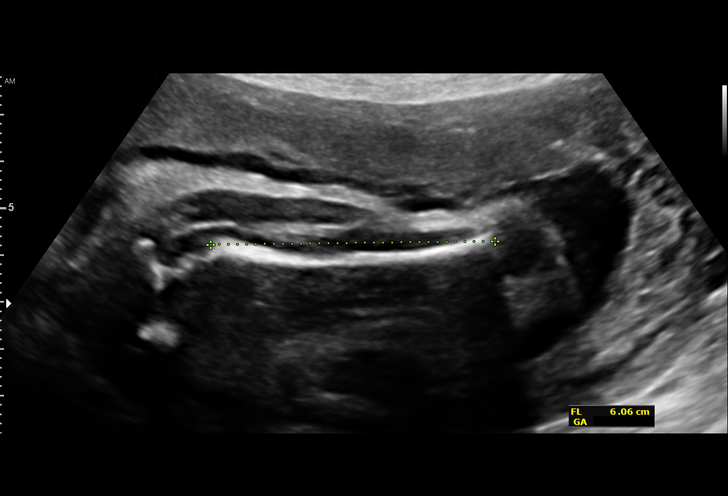
[im 46/65]
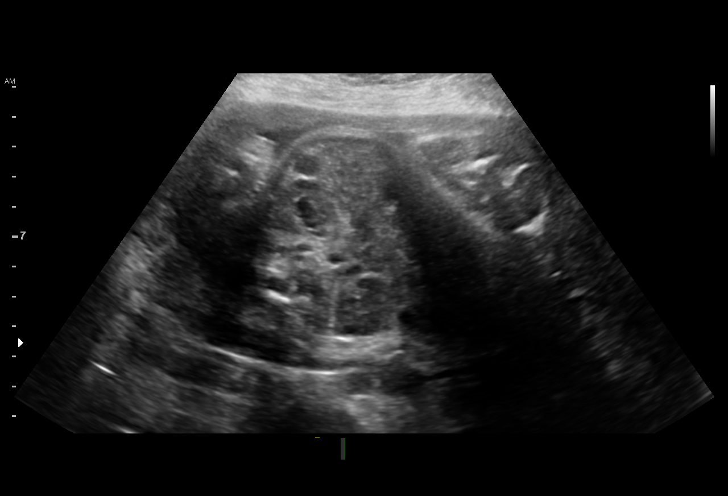
[im 50/65]
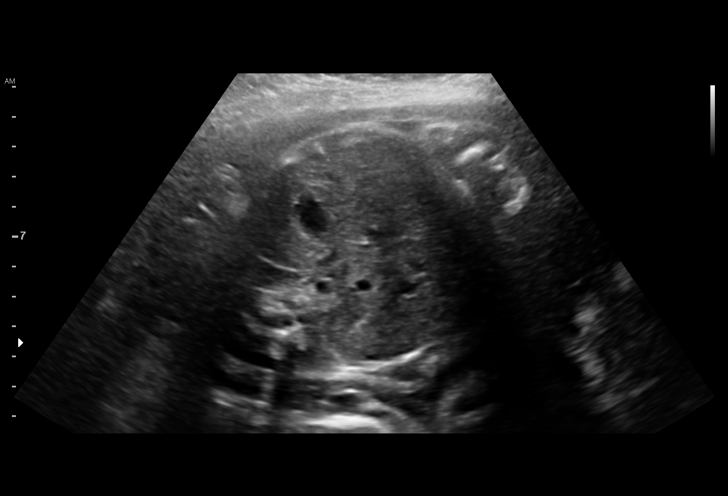
[im 55/65]
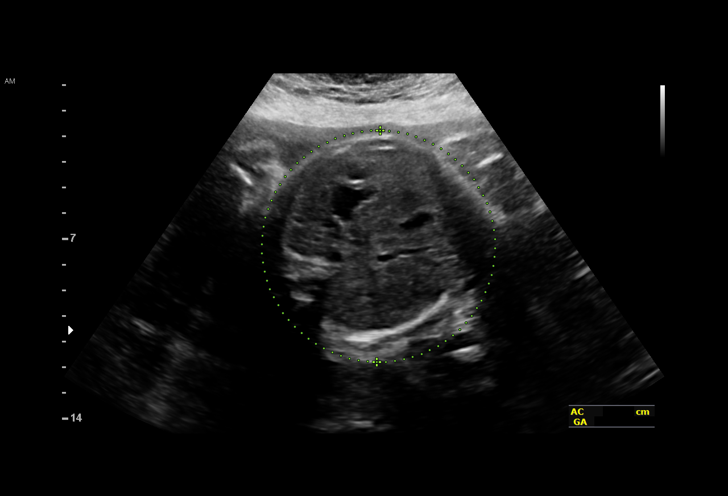
[im 60/65]
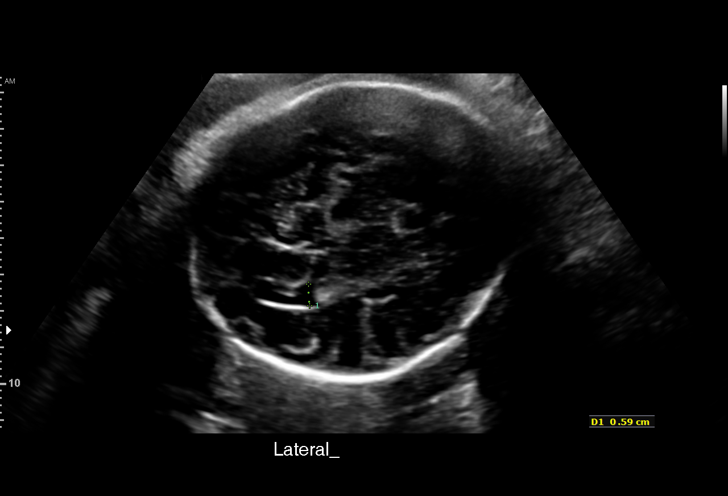
[im 65/65]
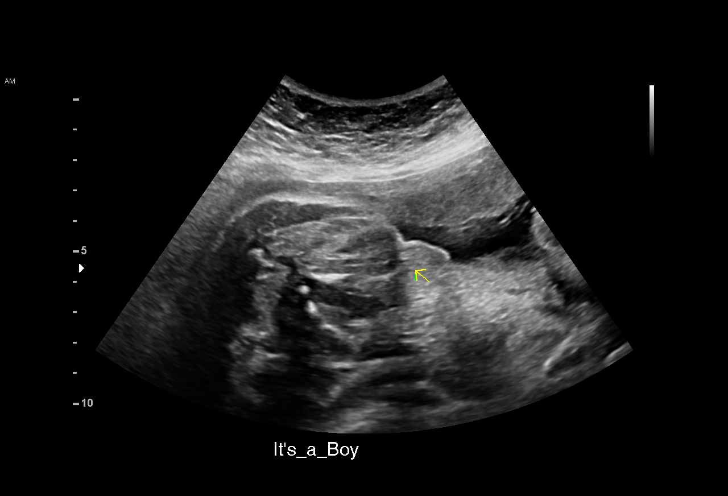

[14 of 28 positions shown; findings below may reference images not displayed]

1  ANA RUTH GUNTHER            [id number]      [id number]     [id number]
Indications

31 weeks gestation of pregnancy
Advanced maternal age multigravida 35+
(39), second trimester; declined PASIC and
testing
Previous cesarean delivery, antepartum
Hypertension - Chronic/Pre-existing
OB History

Blood Type:            Height:         Weight (lb):  206.8     BMI:
Gravidity:    8         Term:   4         SAB:   3
Living:       6
Fetal Evaluation

Num Of Fetuses:     1
Fetal Heart         127
Rate(bpm):
Cardiac Activity:   Observed
Presentation:       Cephalic
Placenta:           Right lateral, above cervical os
P. Cord Insertion:  Previously Visualized

Amniotic Fluid
AFI FV:      Subjectively within normal limits

AFI Sum(cm)     %Tile       Largest Pocket(cm)
12.54           35

RUQ(cm)       RLQ(cm)       LUQ(cm)        LLQ(cm)
3.33
Biometry

BPD:      79.2  mm     G. Age:  31w 5d         55  %    CI:         78.52  %    70 - 86
FL/HC:       21.2  %    19.3 -
HC:      282.7  mm     G. Age:  31w 0d         11  %    HC/AC:       0.98       0.96 -
AC:      288.4  mm     G. Age:  32w 6d         87  %    FL/BPD:      75.5  %    71 - 87
FL:       59.8  mm     G. Age:  31w 1d         33  %    FL/AC:       20.7  %    20 - 24
HUM:      53.1  mm     G. Age:  31w 0d         45  %

Est. FW:    6069   gm     4 lb 3 oz     71  %
Gestational Age

LMP:           31w 2d        Date:  03/27/15                 EDD:    01/01/16
U/S Today:     31w 5d                                        EDD:    12/29/15
Best:          31w 2d     Det. By:  LMP  (03/27/15)          EDD:    01/01/16
Anatomy

Cranium:               Appears normal         Aortic Arch:            Previously seen
Cavum:                 Previously seen        Ductal Arch:            Previously seen
Ventricles:            Appears normal         Diaphragm:              Appears normal
Choroid Plexus:        Previously seen        Stomach:                Appears normal, left
sided
Cerebellum:            Previously seen        Abdomen:                Appears normal
Posterior Fossa:       Previously seen        Abdominal Wall:         Previously seen
Nuchal Fold:           Previously seen        Cord Vessels:           Previously seen
Face:                  Orbits and profile     Kidneys:                Appear normal
previously seen
Lips:                  Previously seen        Bladder:                Appears normal
Thoracic:              Appears normal         Spine:                  Previously seen
Heart:                 Appears normal         Upper Extremities:      Previously seen
(4CH, axis, and
situs)
RVOT:                  Appears normal         Lower Extremities:      Previously seen
LVOT:                  Appears normal

Other:  Male gender previously seen.. Heels and 5th digit previously
visualized.
Cervix Uterus Adnexa

Cervix
Not visualized (advanced GA >74wks)

Uterus
No abnormality visualized.

Left Ovary
Not visualized.

Right Ovary
Not visualized.

Adnexa:       No abnormality visualized.
Impression

SIUP at 31+2 weeks
Normal interval anatomy; anatomic survey complete
Normal amniotic fluid volume
Appropriate interval growth with EFW at the 71st %tile
Recommendations

Follow-up ultrasound for growth in 4-6 weeks

## 2017-07-06 NOTE — Progress Notes (Signed)
Pt did not get mammogram that was ordered in 2018.  RN to call patient and reschedule.

## 2021-07-16 ENCOUNTER — Telehealth: Payer: Medicaid Other | Admitting: Physician Assistant

## 2021-07-16 DIAGNOSIS — K047 Periapical abscess without sinus: Secondary | ICD-10-CM

## 2021-07-16 MED ORDER — MELOXICAM 15 MG PO TABS: 15.0000 mg | ORAL_TABLET | Freq: Every day | ORAL | 0 refills | Status: AC

## 2021-07-16 MED ORDER — AMOXICILLIN 500 MG PO CAPS: 500.0000 mg | ORAL_CAPSULE | Freq: Three times a day (TID) | ORAL | 0 refills | Status: AC

## 2021-07-16 NOTE — Patient Instructions (Signed)
?  [name], thank you for joining Piedad Climes, PA-C for today's virtual visit.  While this provider is not your primary care provider (PCP), if your PCP is located in our provider database this encounter information will be shared with them immediately following your visit. ? ?Consent: ?(Patient) Amy Horn provided verbal consent for this virtual visit at the beginning of the encounter. ? ?Current Medications: ? ?Current Outpatient Medications:  ?  calcium carbonate (TUMS - DOSED IN MG ELEMENTAL CALCIUM) 500 MG chewable tablet, Chew 2 tablets by mouth as needed for indigestion or heartburn., Disp: , Rfl:   ? ?Medications ordered in this encounter:  ?No orders of the defined types were placed in this encounter. ?  ? ?*If you need refills on other medications prior to your next appointment, please contact your pharmacy* ? ?Follow-Up: ?Call back or seek an in-person evaluation if the symptoms worsen or if the condition fails to improve as anticipated. ? ?Other Instructions ?Please keep hydrated. ?Ok to continue your tylenol but start the meloxicam once daily. ?Take the antibiotic as directed with a little bit of food. ?Avoid chewing on affected side. ?Please check out the resources below for dental care. ?If anything is worsening despite treatment, I want you to be evaluated at nearest Urgent Care or ER.  ? ? ? ? ?If you have been instructed to have an in-person evaluation today at a local Urgent Care facility, please use the link below. It will take you to a list of all of our available [facility], including address, phone number and hours of operation. Please do not delay care.  ?[facility] ? ?If you or a family member do not have a primary care provider, use the link below to schedule a visit and establish care. When you choose a [facility] primary care physician or advanced practice provider, you gain a long-term partner in health. ?Find a Primary Care  Provider ? ?Learn more about [facility]'s in-office and virtual care options: ?[facility] - Get Care Now  ?

## 2021-07-16 NOTE — Progress Notes (Signed)
?Virtual Visit Consent  ? ?[name], you are scheduled for a virtual visit with a [facility] provider today. Just as with appointments in the office, your consent must be obtained to participate. Your consent will be active for this visit and any virtual visit you may have with one of our providers in the next 365 days. If you have a MyChart account, a copy of this consent can be sent to you electronically. ? ?As this is a virtual visit, video technology does not allow for your provider to perform a traditional examination. This may limit your provider's ability to fully assess your condition. If your provider identifies any concerns that need to be evaluated in person or the need to arrange testing (such as labs, EKG, etc.), we will make arrangements to do so. Although advances in technology are sophisticated, we cannot ensure that it will always work on either your end or our end. If the connection with a video visit is poor, the visit may have to be switched to a telephone visit. With either a video or telephone visit, we are not always able to ensure that we have a secure connection. ? ?By engaging in this virtual visit, you consent to the provision of healthcare and authorize for your insurance to be billed (if applicable) for the services provided during this visit. Depending on your insurance coverage, you may receive a charge related to this service. ? ?I need to obtain your verbal consent now. Are you willing to proceed with your visit today? Amy Horn has provided verbal consent on 07/16/2021 for a virtual visit (video or telephone). Piedad Climes, PA-C ? ?Date: 07/16/2021 7:11 PM ? ?Virtual Visit via Video Note  ? ?IPiedad Climes, connected with  Amy Horn  ([id number], Dec 27, 1975) on 07/16/21 at  7:00 PM EDT by a video-enabled telemedicine application and verified that I am speaking with the correct person using two identifiers. ? ?Location: ?Patient: Virtual Visit  Location Patient: Home ?Provider: Virtual Visit Location Provider: Home Office ?  ?I discussed the limitations of evaluation and management by telemedicine and the availability of in person appointments. The patient expressed understanding and agreed to proceed.   ? ?History of Present Illness: ?Amy Horn is a 46 y.o. who identifies as a female who was assigned female at birth, and is being seen today for fractured molar and possible infection. Notes over past several days her R bottom molar broke off. Has been chipped for some time but without pain. Now with swelling of surrounding gums with significant pain radiating to her haw and R ear. Denies fever, chills, aches. Is taking Tylenol OTC without any improvement. Has Medicaid and she has been unable to find a dentist that takes her insurance who can see her soon.  ? ? ?HPI: HPI  ?Problems:  ?Patient Active Problem List  ? Diagnosis Date Noted  ? Hypertension 04/30/2016  ? 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 Multigravida of advanced maternal age 51/02/2016  ?  ?Allergies: No Known Allergies ?Medications:  ?Current Outpatient Medications:  ?  amoxicillin (AMOXIL) 500 MG capsule, Take 1 capsule (500 mg total) by mouth 3 (three) times daily for 10 days., Disp: 30 capsule, Rfl: 0 ?  meloxicam (MOBIC) 15 MG tablet, Take 1 tablet (15 mg total) by mouth daily., Disp: 15 tablet, Rfl: 0 ?  calcium carbonate (TUMS - DOSED IN MG ELEMENTAL CALCIUM) 500 MG chewable tablet, Chew 2 tablets by mouth as needed for indigestion or  heartburn., Disp: , Rfl:  ? ?Observations/Objective: ?Patient is well-developed, well-nourished in no acute distress.  ?Resting comfortably at home.  ?Head is normocephalic, atraumatic.  ?No labored breathing. ?Speech is clear and coherent with logical content.  ?Patient is alert and oriented at baseline.  ?Unable to view molar via video. ?R side of face without visible swelling although subjective swelling noted by patient.  ? ?Assessment and Plan: ?1.  Dental infection ?- amoxicillin (AMOXIL) 500 MG capsule; Take 1 capsule (500 mg total) by mouth 3 (three) times daily for 10 days.  Dispense: 30 capsule; Refill: 0 ?- meloxicam (MOBIC) 15 MG tablet; Take 1 tablet (15 mg total) by mouth daily.  Dispense: 15 tablet; Refill: 0 ? ?Start Meloxicam once daily with food. Continue OTC Tylenol. Orajel recommended. Start Amox 500 mg TID x 10 days. Dental resources given. Strict UC precautions reviewed with patient.  ? ?Follow Up Instructions: ?I discussed the assessment and treatment plan with the patient. The patient was provided an opportunity to ask questions and all were answered. The patient agreed with the plan and demonstrated an understanding of the instructions.  A copy of instructions were sent to the patient via MyChart unless otherwise noted below.  ? ?The patient was advised to call back or seek an in-person evaluation if the symptoms worsen or if the condition fails to improve as anticipated. ? ?Time:  ?I spent 10 minutes with the patient via telehealth technology discussing the above problems/concerns.   ? ?Piedad Climes, PA-C ?

## 2022-02-25 ENCOUNTER — Telehealth: Payer: Medicaid Other | Admitting: Family Medicine

## 2022-02-25 DIAGNOSIS — Z20828 Contact with and (suspected) exposure to other viral communicable diseases: Secondary | ICD-10-CM | POA: Diagnosis not present

## 2022-02-25 MED ORDER — OSELTAMIVIR PHOSPHATE 75 MG PO CAPS: 75.0000 mg | ORAL_CAPSULE | Freq: Two times a day (BID) | ORAL | 0 refills | Status: AC

## 2022-02-25 MED ORDER — FLUTICASONE PROPIONATE 50 MCG/ACT NA SUSP: 2.0000 | Freq: Every day | NASAL | 0 refills | Status: AC

## 2022-02-25 NOTE — Patient Instructions (Addendum)
[name], thank you for joining Freddy Finner, NP for today's virtual visit.  While this provider is not your primary care provider (PCP), if your PCP is located in our provider database this encounter information will be shared with them immediately following your visit.   A [facility] MyChart account gives you access to today's visit and all your visits, tests, and labs performed at [facility] " click here if you don't have a [facility] MyChart account or go to [email or web address]  Consent: (Patient) Amy Horn provided verbal consent for this virtual visit at the beginning of the encounter.  Current Medications:  Current Outpatient Medications:    fluticasone (FLONASE) 50 MCG/ACT nasal spray, Place 2 sprays into both nostrils daily., Disp: 16 g, Rfl: 0   oseltamivir (TAMIFLU) 75 MG capsule, Take 1 capsule (75 mg total) by mouth 2 (two) times daily for 5 days., Disp: 10 capsule, Rfl: 0   calcium carbonate (TUMS - DOSED IN MG ELEMENTAL CALCIUM) 500 MG chewable tablet, Chew 2 tablets by mouth as needed for indigestion or heartburn., Disp: , Rfl:    meloxicam (MOBIC) 15 MG tablet, Take 1 tablet (15 mg total) by mouth daily., Disp: 15 tablet, Rfl: 0   Medications ordered in this encounter:  Meds ordered this encounter  Medications   oseltamivir (TAMIFLU) 75 MG capsule    Sig: Take 1 capsule (75 mg total) by mouth 2 (two) times daily for 5 days.    Dispense:  10 capsule    Refill:  0    Order Specific Question:   Supervising Provider    Answer:   Merrilee Jansky [[id number]]   fluticasone (FLONASE) 50 MCG/ACT nasal spray    Sig: Place 2 sprays into both nostrils daily.    Dispense:  16 g    Refill:  0    Order Specific Question:   Supervising Provider    Answer:   Merrilee Jansky [id number]     *If you need refills on other medications prior to your next appointment, please contact your pharmacy*  Follow-Up: Call back or seek an in-person  evaluation if the symptoms worsen or if the condition fails to improve as anticipated.  [facility] [phone]  Other Instructions  Influenza, Adult Influenza is also called "the flu." It is an infection in the lungs, nose, and throat (respiratory tract). It spreads easily from person to person (is contagious). The flu causes symptoms that are like a cold, along with high fever and body aches. What are the causes? This condition is caused by the influenza virus. You can get the virus by: Breathing in droplets that are in the air after a person infected with the flu coughed or sneezed. Touching something that has the virus on it and then touching your mouth, nose, or eyes. What increases the risk? Certain things may make you more likely to get the flu. These include: Not washing your hands often. Having close contact with many people during cold and flu season. Touching your mouth, eyes, or nose without first washing your hands. Not getting a flu shot every year. You may have a higher risk for the flu, and serious problems, such as a lung infection (pneumonia), if you: Are older than 65. Are pregnant. Have a weakened disease-fighting system (immune system) because of a disease or because you are taking certain medicines. Have a long-term (chronic) condition, such as: Heart, kidney, or lung disease. Diabetes. Asthma. Have a liver  disorder. Are very overweight (morbidly obese). Have anemia. What are the signs or symptoms? Symptoms usually begin suddenly and last 4-14 days. They may include: Fever and chills. Headaches, body aches, or muscle aches. Sore throat. Cough. Runny or stuffy (congested) nose. Feeling discomfort in your chest. Not wanting to eat as much as normal. Feeling weak or tired. Feeling dizzy. Feeling sick to your stomach or throwing up. How is this treated? If the flu is found early, you can be treated with antiviral medicine. This can help to  reduce how bad the illness is and how long it lasts. This may be given by mouth or through an IV tube. Taking care of yourself at home can help your symptoms get better. Your doctor may want you to: Take over-the-counter medicines. Drink plenty of fluids. The flu often goes away on its own. If you have very bad symptoms or other problems, you may be treated in a hospital. Follow these instructions at home:     Activity Rest as needed. Get plenty of sleep. Stay home from work or school as told by your doctor. Do not leave home until you do not have a fever for 24 hours without taking medicine. Leave home only to go to your doctor. Eating and drinking Take an ORS (oral rehydration solution). This is a drink that is sold at pharmacies and stores. Drink enough fluid to keep your pee pale yellow. Drink clear fluids in small amounts as you are able. Clear fluids include: Water. Ice chips. Fruit juice mixed with water. Low-calorie sports drinks. Eat bland foods that are easy to digest. Eat small amounts as you are able. These foods include: Bananas. Applesauce. Rice. Lean meats. Toast. Crackers. Do not eat or drink: Fluids that have a lot of sugar or caffeine. Alcohol. Spicy or fatty foods. General instructions Take over-the-counter and prescription medicines only as told by your doctor. Use a cool mist humidifier to add moisture to the air in your home. This can make it easier for you to breathe. When using a cool mist humidifier, clean it daily. Empty water and replace with clean water. Cover your mouth and nose when you cough or sneeze. Wash your hands with soap and water often and for at least 20 seconds. This is also important after you cough or sneeze. If you cannot use soap and water, use alcohol-based hand sanitizer. Keep all follow-up visits. How is this prevented?  Get a flu shot every year. You may get the flu shot in late summer, fall, or winter. Ask your doctor when  you should get your flu shot. Avoid contact with people who are sick during fall and winter. This is cold and flu season. Contact a doctor if: You get new symptoms. You have: Chest pain. Watery poop (diarrhea). A fever. Your cough gets worse. You start to have more mucus. You feel sick to your stomach. You throw up. Get help right away if you: Have shortness of breath. Have trouble breathing. Have skin or nails that turn a bluish color. Have very bad pain or stiffness in your neck. Get a sudden headache. Get sudden pain in your face or ear. Cannot eat or drink without throwing up. These symptoms may represent a serious problem that is an emergency. Get medical help right away. Call your local emergency services (911 in the [place] Do not wait to see if the symptoms will go away. Do not drive yourself to the hospital. Summary Influenza is also called "the flu." It  is an infection in the lungs, nose, and throat. It spreads easily from person to person. Take over-the-counter and prescription medicines only as told by your doctor. Getting a flu shot every year is the best way to not get the flu. This information is not intended to replace advice given to you by your health care provider. Make sure you discuss any questions you have with your health care provider. Document Revised: 10/07/2019 Document Reviewed: 10/07/2019 Elsevier Patient Education  2023 [employer]vier Inc.    If you have been instructed to have an in-person evaluation today at a local Urgent Care facility, please use the link below. It will take you to a list of all of our available [facility], including address, phone number and hours of operation. Please do not delay care.  [facility]  If you or a family member do not have a primary care provider, use the link below to schedule a visit and establish care. When you choose a [facility] primary care physician or advanced practice provider, you  gain a long-term partner in health. Find a Primary Care Provider  Learn more about [facility]'s in-office and virtual care options: [facility] - Get Care Now

## 2022-02-25 NOTE — Progress Notes (Signed)
Virtual Visit Consent   Amy Horn, you are scheduled for a virtual visit with a [facility] provider today. Just as with appointments in the office, your consent must be obtained to participate. Your consent will be active for this visit and any virtual visit you may have with one of our providers in the next 365 days. If you have a MyChart account, a copy of this consent can be sent to you electronically.  As this is a virtual visit, video technology does not allow for your provider to perform a traditional examination. This may limit your provider's ability to fully assess your condition. If your provider identifies any concerns that need to be evaluated in person or the need to arrange testing (such as labs, EKG, etc.), we will make arrangements to do so. Although advances in technology are sophisticated, we cannot ensure that it will always work on either your end or our end. If the connection with a video visit is poor, the visit may have to be switched to a telephone visit. With either a video or telephone visit, we are not always able to ensure that we have a secure connection.  By engaging in this virtual visit, you consent to the provision of healthcare and authorize for your insurance to be billed (if applicable) for the services provided during this visit. Depending on your insurance coverage, you may receive a charge related to this service.  I need to obtain your verbal consent now. Are you willing to proceed with your visit today? Amy Horn has provided verbal consent on 02/25/2022 for a virtual visit (video or telephone). Freddy Finner, NP  Date: 02/25/2022 9:10 AM  Virtual Visit via Video Note   I, Freddy Finner, connected with  Amy Horn  ([id number], December 21, 1975) on 02/25/22 at  9:15 AM EST by a video-enabled telemedicine application and verified that I am speaking with the correct person using two identifiers.  Location: Patient: Virtual Visit Location  Patient: Home Provider: Virtual Visit Location Provider: Home Office   I discussed the limitations of evaluation and management by telemedicine and the availability of in person appointments. The patient expressed understanding and agreed to proceed.    History of Present Illness: Amy Horn is a 46 y.o. who identifies as a female who was assigned female at birth, and is being seen today for flu like symptoms onset yesterday- Flu with headache, jaw- ear pain, nasal congestion, body aches. Denies fevers, chills, chest pain, shortness of breath. Known exposure to flu.   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  Resting comfortably  at home.  Head is normocephalic, atraumatic.  No labored breathing.  Speech is clear and coherent with logical content.  Patient is alert and oriented at baseline.  Cough Congestion/nasal tone noted  Assessment and Plan:   1. Exposure to the flu  - oseltamivir (TAMIFLU) 75 MG capsule; Take 1 capsule (75 mg total)  by mouth 2 (two) times daily for 5 days.  Dispense: 10 capsule; Refill: 0 - fluticasone (FLONASE) 50 MCG/ACT nasal spray; Place 2 sprays into both nostrils daily.  Dispense: 16 g; Refill: 0   -Take meds as prescribed -Rest -Use a cool mist humidifier especially during the winter months when heat dries out the air. - Use saline nose sprays frequently to help soothe nasal passages  and promote drainage. -stay hydrated by drinking plenty of fluids - Keep thermostat turn down low to prevent drying out sinuses - For any cough or congestion- robitussin DM or Delsym as needed - For fever or aches or pains- take tylenol or ibuprofen as directed on bottle             * for fevers greater than 101 orally you may alternate ibuprofen and tylenol every 3 hours.  If you do not improve you will need a follow up visit in person.                Reviewed side effects, risks and benefits of medication.    Patient acknowledged agreement and understanding of the plan.   Past Medical, Surgical, Social History, Allergies, and Medications have been Reviewed.    Follow Up Instructions: I discussed the assessment and treatment plan with the patient. The patient was provided an opportunity to ask questions and all were answered. The patient agreed with the plan and demonstrated an understanding of the instructions.  A copy of instructions were sent to the patient via MyChart unless otherwise noted below.     The patient was advised to call back or seek an in-person evaluation if the symptoms worsen or if the condition fails to improve as anticipated.  Time:  I spent 10 minutes with the patient via telehealth technology discussing the above problems/concerns.    Freddy Finner, NP

## 2022-04-15 ENCOUNTER — Telehealth: Payer: Medicaid Other | Admitting: Emergency Medicine

## 2022-04-15 DIAGNOSIS — H9202 Otalgia, left ear: Secondary | ICD-10-CM | POA: Diagnosis not present

## 2022-04-15 NOTE — Progress Notes (Signed)
Virtual Visit Consent   Amy Horn, you are scheduled for a virtual visit with a [facility] provider today. Just as with appointments in the office, your consent must be obtained to participate. Your consent will be active for this visit and any virtual visit you may have with one of our providers in the next 365 days. If you have a MyChart account, a copy of this consent can be sent to you electronically.  As this is a virtual visit, video technology does not allow for your provider to perform a traditional examination. This may limit your provider's ability to fully assess your condition. If your provider identifies any concerns that need to be evaluated in person or the need to arrange testing (such as labs, EKG, etc.), we will make arrangements to do so. Although advances in technology are sophisticated, we cannot ensure that it will always work on either your end or our end. If the connection with a video visit is poor, the visit may have to be switched to a telephone visit. With either a video or telephone visit, we are not always able to ensure that we have a secure connection.  By engaging in this virtual visit, you consent to the provision of healthcare and authorize for your insurance to be billed (if applicable) for the services provided during this visit. Depending on your insurance coverage, you may receive a charge related to this service.  I need to obtain your verbal consent now. Are you willing to proceed with your visit today? Amy Horn has provided verbal consent on 04/15/2022 for a virtual visit (video or telephone). Carvel Getting, NP  Date: 04/15/2022 9:28 AM  Virtual Visit via Video Note   I, Carvel Getting, connected with  Amy Horn  ([id number], 06-10-1975) on 04/15/22 at  9:15 AM EST by a video-enabled telemedicine application and verified that I am speaking with the correct person using two identifiers.  Location: Patient: Virtual Visit Location  Patient: Home Provider: Virtual Visit Location Provider: Home Office   I discussed the limitations of evaluation and management by telemedicine and the availability of in person appointments. The patient expressed understanding and agreed to proceed.    History of Present Illness: Amy Horn is a 47 y.o. who identifies as a female who was assigned female at birth, and is being seen today for L ear pain.  Patient has been congested for the last several days and has not done anything to treat it because she normally just lets it resolve itself.  Last night she developed a low-grade fever of 99.8 F and had throbbing left ear pain which she treated with ibuprofen.  She has not tried nasal saline or or used anything such as Flonase for her nasal congestion but she has tried Flonase in the past.  She feels like the pain is on the inside of her ear because pushing on the tragus of her ear helps relieve the symptoms a little bit.  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  Resting comfortably  at home.  Head is normocephalic, atraumatic.  No labored breathing.  Speech is clear and coherent with logical content.  Patient is alert and oriented at baseline.    Assessment and Plan: 1.  Otalgia of left ear  Discussed relieving congestion to help relieve the pain and possible infection in her ear and that antibiotics are not needed at this time.  Follow Up Instructions: I discussed the assessment and treatment plan with the patient. The patient was provided an opportunity to ask questions and all were answered. The patient agreed with the plan and demonstrated an understanding of the instructions.  A copy of instructions were sent to the patient via MyChart unless otherwise noted below.    The patient was advised to call back or seek an in-person evaluation if the symptoms worsen or if the condition fails to improve as anticipated.  Time:  I spent 10 minutes with the patient via telehealth technology discussing the above problems/concerns.    Carvel Getting, NP

## 2022-04-15 NOTE — Patient Instructions (Addendum)
Leanne Chang, thank you for joining Carvel Getting, NP for today's virtual visit.  While this provider is not your primary care provider (PCP), if your PCP is located in our provider database this encounter information will be shared with them immediately following your visit.   [facility] account gives you access to today's visit and all your visits, tests, and labs performed at [facility] " click here if you don't have a [facility] account or go to [email or web address]  Consent: (Patient) Amy Horn provided verbal consent for this virtual visit at the beginning of the encounter.  Current Medications:  Current Outpatient Medications:    calcium carbonate (TUMS - DOSED IN MG ELEMENTAL CALCIUM) 500 MG chewable tablet, Chew 2 tablets by mouth as needed for indigestion or heartburn., Disp: , Rfl:    fluticasone (FLONASE) 50 MCG/ACT nasal spray, Place 2 sprays into both nostrils daily., Disp: 16 g, Rfl: 0   meloxicam (MOBIC) 15 MG tablet, Take 1 tablet (15 mg total) by mouth daily., Disp: 15 tablet, Rfl: 0   Medications ordered in this encounter:  No orders of the defined types were placed in this encounter.    *If you need refills on other medications prior to your next appointment, please contact your pharmacy*  Follow-Up: Call back or seek an in-person evaluation if the symptoms worsen or if the condition fails to improve as anticipated.  [facility] [phone]  Other Instructions Use saline spray several times a day to help your congestion drain.  I would also go back to using Flonase nasal spray, not at the same time as the saline spray, so that you do not wash the Flonase out of your nose with the saline. This will help your congestion to drain and help relieve the pressure/infection in your ear.   What causes middle ear infections? Most middle ear infections occur when an infection such as a cold, leads to a  build-up of mucus in the middle ear and causes the Eustachian tube (a thin tube that runs from the middle ear to the back of the nose) to become swollen or blocked. This means mucus can't drain away properly, making it easier for an infection to spread into the middle ear.  How middle ear infections are treated: Most ear infections clear up within three to five days and don't need any specific treatment. If necessary, tylenol or ibuprofen should be used to relieve pain and a high temperature.  If you develop significantly worsening symptoms, this could indicate a more serious infection moving to the middle/inner and needs face to face evaluation in an office by a provider.   Antibiotics aren't routinely used to treat middle ear infections, as most clear up on their own.   If you have been instructed to have an in-person evaluation today at a local Urgent Care facility, please use the link below. It will take you to a list of all of our available [facility], including address, phone number and hours of operation. Please do not delay care.  [facility]  If you or a family member do not have a primary care provider, use the link below to schedule a visit and establish care. When you choose a [facility] primary care physician or advanced practice provider, you gain a long-term partner in health. Find a Primary Care Provider  Learn more about [facility]'s in-office and virtual care options: [facility]

## 2022-07-21 ENCOUNTER — Telehealth: Payer: Medicaid Other
